# Patient Record
Sex: Male | Born: 1955 | Race: White | Hispanic: No | Marital: Married | Smoking: Never smoker
Health system: Southern US, Community
[De-identification: ages and names within clinical notes are randomized; demographics above are authoritative.]

## PROBLEM LIST (undated history)

## (undated) DIAGNOSIS — M1990 Unspecified osteoarthritis, unspecified site: Secondary | ICD-10-CM

## (undated) DIAGNOSIS — A809 Acute poliomyelitis, unspecified: Secondary | ICD-10-CM

## (undated) DIAGNOSIS — R0600 Dyspnea, unspecified: Secondary | ICD-10-CM

## (undated) DIAGNOSIS — K219 Gastro-esophageal reflux disease without esophagitis: Secondary | ICD-10-CM

## (undated) HISTORY — PX: BACK SURGERY: SHX140

---

## 2001-05-22 ENCOUNTER — Emergency Department (HOSPITAL_COMMUNITY): Admission: EM | Admit: 2001-05-22 | Discharge: 2001-05-22 | Payer: Self-pay | Admitting: Emergency Medicine

## 2001-05-22 ENCOUNTER — Encounter: Payer: Self-pay | Admitting: Emergency Medicine

## 2001-10-09 ENCOUNTER — Ambulatory Visit (HOSPITAL_BASED_OUTPATIENT_CLINIC_OR_DEPARTMENT_OTHER): Admission: RE | Admit: 2001-10-09 | Discharge: 2001-10-10 | Payer: Self-pay | Admitting: Orthopedic Surgery

## 2002-05-23 ENCOUNTER — Encounter: Payer: Self-pay | Admitting: Orthopedic Surgery

## 2002-05-23 ENCOUNTER — Ambulatory Visit (HOSPITAL_COMMUNITY): Admission: RE | Admit: 2002-05-23 | Discharge: 2002-05-23 | Payer: Self-pay | Admitting: Orthopedic Surgery

## 2003-11-13 ENCOUNTER — Ambulatory Visit (HOSPITAL_COMMUNITY): Admission: RE | Admit: 2003-11-13 | Discharge: 2003-11-13 | Payer: Self-pay | Admitting: Family Medicine

## 2004-11-09 ENCOUNTER — Ambulatory Visit (HOSPITAL_BASED_OUTPATIENT_CLINIC_OR_DEPARTMENT_OTHER): Admission: RE | Admit: 2004-11-09 | Discharge: 2004-11-09 | Payer: Self-pay | Admitting: Orthopedic Surgery

## 2004-11-09 ENCOUNTER — Ambulatory Visit (HOSPITAL_COMMUNITY): Admission: RE | Admit: 2004-11-09 | Discharge: 2004-11-09 | Payer: Self-pay | Admitting: Orthopedic Surgery

## 2008-12-31 ENCOUNTER — Ambulatory Visit (HOSPITAL_COMMUNITY): Admission: RE | Admit: 2008-12-31 | Discharge: 2009-01-01 | Payer: Self-pay | Admitting: Neurological Surgery

## 2009-03-08 ENCOUNTER — Encounter: Admission: RE | Admit: 2009-03-08 | Discharge: 2009-03-08 | Payer: Self-pay | Admitting: Neurological Surgery

## 2009-05-19 ENCOUNTER — Emergency Department (HOSPITAL_COMMUNITY): Admission: EM | Admit: 2009-05-19 | Discharge: 2009-05-19 | Payer: Self-pay | Admitting: Pediatric Emergency Medicine

## 2010-11-17 LAB — CBC
Hemoglobin: 14.7 g/dL (ref 13.0–17.0)
RBC: 4.7 MIL/uL (ref 4.22–5.81)
RDW: 13.4 % (ref 11.5–15.5)
WBC: 7.4 10*3/uL (ref 4.0–10.5)

## 2010-12-22 NOTE — Op Note (Signed)
NAMERENNE, PLATTS               ACCOUNT NO.:  [id number]   MEDICAL RECORD NO.:  [id number]          PATIENT TYPE:  INP   LOCATION:  3524                         FACILITY:  [facility]   PHYSICIAN:  Stefani Dama, M.D.  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   POSTOPERATIVE DIAGNOSIS:  Herniated nucleus pulposus at L2-L3, left with  lumbar radiculopathy.   PROCEDURE:  Microdiskectomy L2-L3, left with operating microscope  microdissection technique.   SURGEON:  Stefani Dama, MD   FIRST ASSISTANT:  Cristi Loron, MD   INDICATIONS:  [name] is a 55 year old individual who has had  significant pain and weakness in his left lower extremity.  He had polio  as a child and had weakness in the left leg with recent injury, but he  developed a herniated nucleus pulposus with severe left leg weakness in  the iliopsoas and the quad.  He has been advised regarding the need for  surgical intervention, which was to be done via METRx diskectomy,  however at the time of procedure, the METRx instrumentation was not  available and the microdiskectomy was performed.   PROCEDURE:  The patient was brought to the operating room, supine on a  stretcher.  After smooth induction of general endotracheal anesthesia,  he was turned prone.  The back was prepped with alcohol and DuraPrep,  and draped in a sterile fashion.  Fluoroscopic guidance was used to  localize the level of L2-L3 on the left side and then a small vertical  incision was created on the left side of the back.  The dissection was  carried down through the superficial fascia and then a series of METRx  dilators were placed in sequence to expose the interlaminar space at L2-  L3 on the left side.  Because, the tube was not available, the  dilators  were removed and a self-retaining McCullough retractor was placed in the  wound.  The interlaminar space was then cleared of soft tissue using a  monopolar cautery and then the laminotomy was created removing the  inferior margin lamina of L2 out to the medial wall of the facet.  The  yellow ligament was taken up in this area and a common dural tube was  exposed.  The takeoff of the L3 nerve root was identified.  This was  bowed medially, secondary to a large lateral mass.  Further dissection  of some epidural veins in this area, which were divided after being  cauterized yielded a large fragment of disk, singular fragment was first  removed and then 2 other fragments more distally located underneath the  nerve root were identified and these were removed, also allowed for good  mobility of the nerve root in the region of the foramen and no further  compressing fragments were identified.  More proximally, at the level of  the disk space, there was noted to be an opening from  posterior  longitudinal ligament into the area just medial to the common dural tube  where the initial disk fragment was found.  This area was opened with a  #15 blade and a combination of Kerrison rongeurs was then used to  evacuate this moderate quantity of severely degenerated and desiccated  disk material from within the disk space.  Once this space was cleared,  no other fragments could be found both medially and laterally.  The  epidural veins were checked for hemostasis.  The common dural tube and  the nerve roots were again explored and found to be free and clear in  the path at the foramen.  At this point, microscope was removed.  The  lumbodorsal fascia was closed with #1 Vicryl in interrupted fashion and  2-0 Vicryl was used in a subcutaneous tissues, and 3-0 Vicryl  subcuticularly.  Dr. Lovell Sheehan helped during the portions of the procedure  including the  exploration of the nerve root and removal of  disk, and evacuation of the  disk space both by providing retraction and also by doing lateral  portion of dissection.  The patient tolerated the procedure well.  Blood  loss was estimated at 20 mL.      Stefani Dama, M.D.  Electronically Signed     HJE/MEDQ  D:  12/31/2008  T:  01/01/2009  Job:  [id number]

## 2010-12-25 NOTE — Op Note (Signed)
[facility]  Patient:    WAYLIN, DORKO Visit Number: [id number] MRN: [id number]          Service Type: DSU Location: [facility] Attending Physician:  Twana First Dictated by:   Elana Alm Thurston Hole, M.D. Proc. Date: 10/09/01 Admit Date:  10/09/2001                             Operative Report  PREOPERATIVE DIAGNOSIS:  Left shoulder rotator cuff tear.  POSTOPERATIVE DIAGNOSES: 1. Left shoulder rotator cuff tear. 2. Left shoulder partial glenoid labrum tear. 3. Left shoulder impingement.  PROCEDURE: 1. Left shoulder examination under anesthesia followed by arthroscopic partial    labrum tear debridement. 2. Left shoulder rotator cuff repair. 3. Left shoulder subacromial decompression.  SURGEON:  Elana Alm. Thurston Hole, M.D.  ANESTHESIA:  General.  OPERATIVE TIME:  One hour and 15 minutes.  COMPLICATIONS:  None.  INDICATION FOR PROCEDURE:  Mr. Gibbons is a 55 year old gentleman who has had significant left shoulder pain for the past year increasing in nature.  His pain is made worse with activity and has not been relieved by rest or time. Persistent pain noted.  He is now to undergo arthroscopy and rotator cuff repair.  DESCRIPTION OF PROCEDURE:  Mr. Mcauliffe was brought to the operating room on October 09, 2001 after a supraclavicular block had been placed in the holding room.  He was placed on the operative table in supine position.  His left shoulder was examined under anesthesia.  He had full range of motion and his shoulder was stable to ligamentous exam.  He was then placed in a beach chair position and his shoulder and arm were prepped using sterile Betadine and draped using sterile technique.  Originally, through a posterior arthroscopic portal, the arthroscope with a pump attachment was placed and through an anterior portal, an arthroscope probe was placed.  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  The anteroinferior glenohumeral ligament complex was intact.  The middle and superior labrum was intact.  Biceps tendon anchor was intact.  Biceps tendon was intact. Posterior labrum was intact.  Articular cartilage on the rest of the humeral head was intact and the rest of the glenoid.  Rotator cuff was inspected from the articular surface; there was not a definite complete or full-thickness tear noted.  Subacromial space was entered and a lateral arthroscopic portal was made.  A large amount of bursitis was resected.  Underneath this, the rotator cuff showed a high-grade partial versus complete rotator cuff tear anteriorly at the supraspinatus anterior region.  Subacromial decompression was carried out, removing 6 to 8 mm of the undersurface of the anterior, anterolateral and anteromedial acromion, and a CA ligament release carried out; a lateral portal had been made to facilitate this.  After this was done, then just anterior to this portal, a 3-cm deltoid-splitting incision was made. The underlying subcutaneous tissues were incised in line with the skin incision and the subacromial space was entered.  The rotator cuff tear was well-visualized.  It was sharply debrided and then an Arthrex suture anchor was placed in the greater tuberosity and each of these sutures attached to it were passed through the rotator cuff tear and  tied down, thus securing the tear back down to the greater tuberosity.  After this was done, the shoulder could be brought through a full range of motion with no impingement on the repair.  At this point, the wound was irrigated and then a subacromial pain catheter was placed for postoperative pain control.  The deltoid fascia was then closed with 2-0  Vicryl, subcutaneous tissues closed with 2-0 Vicryl and Steri-Strips placed on the skin.  The arthroscopic portal was closed with 3-0 nylon, sterile dressings and a sling were applied and then the patient awakened and taken to recovery room in stable condition.  FOLLOWUP CARE:  Mr. Poynter will followed as an overnight recovery-care patient for IV pain control and neurovascular monitoring, discharged tomorrow on Percocet for pain and Bextra.  See him back in the office in a week for sutures out and followup. Dictated by:   Elana Alm Thurston Hole, M.D. Attending Physician:  Twana First DD:  10/09/01 TD:  10/09/01 Job: [id number]

## 2010-12-25 NOTE — Op Note (Signed)
Joshua Shelton, Joshua Shelton               ACCOUNT NO.:  [id number]   MEDICAL RECORD NO.:  [id number]          PATIENT TYPE:  AMB   LOCATION:  DSC                          FACILITY:  [facility]   PHYSICIAN:  Robert A. Thurston Hole, M.D. DATE OF BIRTH:  [birth year]   DATE OF PROCEDURE:  11/09/2004  DATE OF DISCHARGE:                                 OPERATIVE REPORT   PREOPERATIVE DIAGNOSIS:  Right shoulder partial rotator cuff tear with  partial labrum tear and impingement.   POSTOPERATIVE DIAGNOSIS:  Right shoulder partial rotator cuff tear with  partial labrum tear and impingement.   PROCEDURE:  1.  Right shoulder EUA followed by arthroscopic debridement partial rotator      cuff tear and partial labrum tear.  2.  Right shoulder subacromial decompression.   SURGEON:  Elana Alm. Thurston Hole, M.D.   ASSISTANT:  Julien Girt, P.A.   ANESTHESIA:  General.   OPERATIVE TIME:  45 minutes.   COMPLICATIONS:  None.   INDICATIONS FOR PROCEDURE:  Joshua Shelton is a 55 year old gentleman who has  been having significant right shoulder pain for the past six to eight  months, increasing in nature, with exam and MRI documenting a partial  rotator cuff tear with impingement who has failed conservative care and is  now to undergo arthroscopy.   DESCRIPTION:  Joshua Shelton was brought to operating room on November 09, 2004  after an interscalene block had been placed in the holding room. He is  placed operative table supine position. After being placed under general  anesthesia, his right shoulder was examined. He had full range of motion in  the shoulder was stable to ligamentous exam. He was then placed in beach  chair position and his shoulder amd arm were prepped using sterile DuraPrep  and draped using sterile technique. Originally through a posterior  arthroscopic portal, the arthroscope with a pump attached was placed and  through an anterior portal. Arthroscopic probe was placed. On initial  inspection the  articular cartilage of the glenohumeral joint showed mild  Grade 1 and 2 chondromalacia.  He had partial tearing of the labrum  anteriorly and superiorly 25 to 30% and this was debrided. Biceps tendon  anchor and biceps tendon was intact. Inferior labrum and anterior inferior  glenohumeral ligament complex was intact. Posterior labrum showed partial  tearing 25% and this was debrided.  Anterior inferior glenohumeral ligament  complex was intact. Inferior capsular recess free of pathology. Rotator cuff  was thoroughly inspected. He had only mild partial tearing on the articular  surface 20% supraspinatus and the infraspinatus and this was debrided.  Otherwise the rotator cuff was intact. After this was done the subacromial  space was entered. A lateral arthroscopic portal was made. Moderately  thickened bursitis was resected. The rotator cuff was inflamed and thickened  on the bursal surface with partial tearing 25 to 30% and this was debrided  but a complete tear was not found. Impingement was noted in a subacromial  decompression was carried out removing 6 mm of the undersurface of the  anterior, anterolateral, anteromedial acromion and CA ligament  release  carried out as well. The [facility] joint was not impinging on motion and was not  resected. After this was done, the shoulder could be brought through a full  range of motion with no impingement on the rotator cuff. At this point was  felt that all pathology been satisfactorily addressed. The instruments were  removed. Portals were closed with 3-0 nylon suture. Sterile dressings and a  sling applied and the patient awakened and taken to recovery room in stable  condition.   FOLLOW-UP CARE:  Joshua Shelton be followed as an outpatient on Percocet for  pain.  See him back in the office in a week for sutures out and follow-up.  Begin early physical therapy.       RAW/MEDQ  D:  11/09/2004  T:  11/09/2004  Job:  [id number]

## 2013-08-07 ENCOUNTER — Encounter: Payer: Self-pay | Admitting: Physical Medicine & Rehabilitation

## 2013-09-11 ENCOUNTER — Ambulatory Visit: Payer: Self-pay | Admitting: Physical Medicine & Rehabilitation

## 2018-08-16 ENCOUNTER — Other Ambulatory Visit: Payer: Self-pay | Admitting: Family Medicine

## 2018-08-16 ENCOUNTER — Ambulatory Visit
Admission: RE | Admit: 2018-08-16 | Discharge: 2018-08-16 | Disposition: A | Discharge status: Home / Self Care | Payer: Medicare Other | Source: Ambulatory Visit | Attending: Family Medicine | Admitting: Family Medicine

## 2018-08-16 DIAGNOSIS — J189 Pneumonia, unspecified organism: Secondary | ICD-10-CM

## 2018-08-25 ENCOUNTER — Ambulatory Visit (HOSPITAL_COMMUNITY): Payer: Medicare Other | Attending: Cardiovascular Disease

## 2018-08-25 ENCOUNTER — Other Ambulatory Visit (HOSPITAL_COMMUNITY): Payer: Self-pay | Admitting: Family Medicine

## 2018-08-25 ENCOUNTER — Other Ambulatory Visit: Payer: Self-pay | Admitting: Family Medicine

## 2018-08-25 ENCOUNTER — Other Ambulatory Visit: Payer: Self-pay

## 2018-08-25 DIAGNOSIS — R0600 Dyspnea, unspecified: Secondary | ICD-10-CM | POA: Diagnosis present

## 2018-08-25 DIAGNOSIS — G4733 Obstructive sleep apnea (adult) (pediatric): Secondary | ICD-10-CM

## 2019-02-13 ENCOUNTER — Observation Stay (HOSPITAL_COMMUNITY)
Admission: EM | Admit: 2019-02-13 | Discharge: 2019-02-14 | Disposition: A | Discharge status: Home / Self Care | Payer: Medicare Other | Attending: Internal Medicine | Admitting: Internal Medicine

## 2019-02-13 ENCOUNTER — Encounter (HOSPITAL_COMMUNITY): Payer: Self-pay

## 2019-02-13 ENCOUNTER — Other Ambulatory Visit: Payer: Self-pay

## 2019-02-13 ENCOUNTER — Emergency Department (HOSPITAL_COMMUNITY): Payer: Medicare Other

## 2019-02-13 DIAGNOSIS — L02519 Cutaneous abscess of unspecified hand: Secondary | ICD-10-CM

## 2019-02-13 DIAGNOSIS — E876 Hypokalemia: Secondary | ICD-10-CM | POA: Diagnosis present

## 2019-02-13 DIAGNOSIS — R0789 Other chest pain: Secondary | ICD-10-CM | POA: Diagnosis not present

## 2019-02-13 DIAGNOSIS — W458XXA Other foreign body or object entering through skin, initial encounter: Secondary | ICD-10-CM | POA: Diagnosis not present

## 2019-02-13 DIAGNOSIS — S61212A Laceration without foreign body of right middle finger without damage to nail, initial encounter: Principal | ICD-10-CM | POA: Insufficient documentation

## 2019-02-13 DIAGNOSIS — Z79899 Other long term (current) drug therapy: Secondary | ICD-10-CM | POA: Diagnosis not present

## 2019-02-13 DIAGNOSIS — L03011 Cellulitis of right finger: Secondary | ICD-10-CM | POA: Diagnosis present

## 2019-02-13 DIAGNOSIS — Z1159 Encounter for screening for other viral diseases: Secondary | ICD-10-CM | POA: Insufficient documentation

## 2019-02-13 HISTORY — DX: Acute poliomyelitis, unspecified: A80.9

## 2019-02-13 NOTE — ED Triage Notes (Signed)
Pt has swelling on right middle finger. Pt states he went away for the weekend and appeared on Monday. Pt has small lac on tip of finger.

## 2019-02-14 ENCOUNTER — Other Ambulatory Visit: Payer: Self-pay

## 2019-02-14 ENCOUNTER — Encounter (HOSPITAL_COMMUNITY): Payer: Self-pay | Admitting: Internal Medicine

## 2019-02-14 DIAGNOSIS — S61212A Laceration without foreign body of right middle finger without damage to nail, initial encounter: Secondary | ICD-10-CM | POA: Diagnosis not present

## 2019-02-14 DIAGNOSIS — Z1159 Encounter for screening for other viral diseases: Secondary | ICD-10-CM | POA: Diagnosis not present

## 2019-02-14 DIAGNOSIS — L03011 Cellulitis of right finger: Secondary | ICD-10-CM

## 2019-02-14 DIAGNOSIS — E876 Hypokalemia: Secondary | ICD-10-CM | POA: Diagnosis not present

## 2019-02-14 DIAGNOSIS — R0789 Other chest pain: Secondary | ICD-10-CM | POA: Diagnosis not present

## 2019-02-14 LAB — COMPREHENSIVE METABOLIC PANEL
ALT: 35 U/L (ref 0–44)
AST: 34 U/L (ref 15–41)
Albumin: 3.6 g/dL (ref 3.5–5.0)
Alkaline Phosphatase: 73 U/L (ref 38–126)
Anion gap: 8 (ref 5–15)
BUN: 16 mg/dL (ref 8–23)
CO2: 25 mmol/L (ref 22–32)
Calcium: 8.3 mg/dL — ABNORMAL LOW (ref 8.9–10.3)
Chloride: 105 mmol/L (ref 98–111)
Creatinine, Ser: 0.52 mg/dL — ABNORMAL LOW (ref 0.61–1.24)
GFR calc Af Amer: >60 mL/min (ref >60)
GFR calc non Af Amer: >60 mL/min (ref >60)
Glucose, Bld: 104 mg/dL — ABNORMAL HIGH (ref 70–99)
Potassium: 3.3 mmol/L — ABNORMAL LOW (ref 3.5–5.1)
Sodium: 138 mmol/L (ref 135–145)
Total Bilirubin: 0.1 mg/dL — ABNORMAL LOW (ref 0.3–1.2)
Total Protein: 6.5 g/dL (ref 6.5–8.1)

## 2019-02-14 LAB — C-REACTIVE PROTEIN: CRP: 12.3 mg/dL — ABNORMAL HIGH (ref <1.0)

## 2019-02-14 LAB — CBC WITH DIFFERENTIAL/PLATELET
Abs Immature Granulocytes: 0.03 10*3/uL (ref 0.00–0.07)
Basophils Absolute: 0 10*3/uL (ref 0.0–0.1)
Basophils Relative: 0 %
Eosinophils Absolute: 0.5 10*3/uL (ref 0.0–0.5)
Eosinophils Relative: 5 %
HCT: 37.8 % — ABNORMAL LOW (ref 39.0–52.0)
Hemoglobin: 12.3 g/dL — ABNORMAL LOW (ref 13.0–17.0)
Immature Granulocytes: 0 %
Lymphocytes Relative: 30 %
Lymphs Abs: 2.9 10*3/uL (ref 0.7–4.0)
MCH: 30.1 pg (ref 26.0–34.0)
MCHC: 32.5 g/dL (ref 30.0–36.0)
MCV: 92.6 fL (ref 80.0–100.0)
Monocytes Absolute: 1.2 10*3/uL — ABNORMAL HIGH (ref 0.1–1.0)
Monocytes Relative: 12 %
Neutro Abs: 5 10*3/uL (ref 1.7–7.7)
Neutrophils Relative %: 53 %
Platelets: 316 10*3/uL (ref 150–400)
RBC: 4.08 MIL/uL — ABNORMAL LOW (ref 4.22–5.81)
RDW: 13.3 % (ref 11.5–15.5)
WBC: 9.6 10*3/uL (ref 4.0–10.5)
nRBC: 0 % (ref 0.0–0.2)

## 2019-02-14 LAB — BASIC METABOLIC PANEL
Anion gap: 9 (ref 5–15)
BUN: 14 mg/dL (ref 8–23)
CO2: 24 mmol/L (ref 22–32)
Calcium: 8.1 mg/dL — ABNORMAL LOW (ref 8.9–10.3)
Chloride: 106 mmol/L (ref 98–111)
Creatinine, Ser: 0.51 mg/dL — ABNORMAL LOW (ref 0.61–1.24)
GFR calc Af Amer: >60 mL/min (ref >60)
GFR calc non Af Amer: >60 mL/min (ref >60)
Glucose, Bld: 103 mg/dL — ABNORMAL HIGH (ref 70–99)
Potassium: 3.5 mmol/L (ref 3.5–5.1)
Sodium: 139 mmol/L (ref 135–145)

## 2019-02-14 LAB — CBC
HCT: 35.8 % — ABNORMAL LOW (ref 39.0–52.0)
Hemoglobin: 11.4 g/dL — ABNORMAL LOW (ref 13.0–17.0)
MCH: 29.8 pg (ref 26.0–34.0)
MCHC: 31.8 g/dL (ref 30.0–36.0)
MCV: 93.7 fL (ref 80.0–100.0)
Platelets: 286 10*3/uL (ref 150–400)
RBC: 3.82 MIL/uL — ABNORMAL LOW (ref 4.22–5.81)
RDW: 13.4 % (ref 11.5–15.5)
WBC: 8.9 10*3/uL (ref 4.0–10.5)
nRBC: 0 % (ref 0.0–0.2)

## 2019-02-14 LAB — RAPID URINE DRUG SCREEN, HOSP PERFORMED
Amphetamines: NOT DETECTED
Barbiturates: NOT DETECTED
Benzodiazepines: NOT DETECTED
Cocaine: NOT DETECTED
Opiates: NOT DETECTED
Tetrahydrocannabinol: NOT DETECTED

## 2019-02-14 LAB — SARS CORONAVIRUS 2 BY RT PCR (HOSPITAL ORDER, PERFORMED IN ~~LOC~~ HOSPITAL LAB): SARS Coronavirus 2: NEGATIVE

## 2019-02-14 LAB — HEMOGLOBIN A1C
Hgb A1c MFr Bld: 5.5 % (ref 4.8–5.6)
Mean Plasma Glucose: 111.15 mg/dL

## 2019-02-14 LAB — HIV ANTIBODY (ROUTINE TESTING W REFLEX): HIV Screen 4th Generation wRfx: NONREACTIVE

## 2019-02-14 LAB — LIPID PANEL
Cholesterol: 146 mg/dL (ref 0–200)
HDL: 36 mg/dL — ABNORMAL LOW (ref >40)
LDL Cholesterol: 97 mg/dL (ref 0–99)
Total CHOL/HDL Ratio: 4.1 RATIO
Triglycerides: 66 mg/dL (ref <150)
VLDL: 13 mg/dL (ref 0–40)

## 2019-02-14 LAB — MAGNESIUM: Magnesium: 2 mg/dL (ref 1.7–2.4)

## 2019-02-14 LAB — APTT: aPTT: 36 seconds (ref 24–36)

## 2019-02-14 LAB — TROPONIN I (HIGH SENSITIVITY)
Troponin I (High Sensitivity): 3 ng/L (ref <18)
Troponin I (High Sensitivity): 2 ng/L (ref <18)

## 2019-02-14 LAB — SEDIMENTATION RATE: Sed Rate: 12 mm/hr (ref 0–16)

## 2019-02-14 LAB — PROTIME-INR
INR: 1 (ref 0.8–1.2)
Prothrombin Time: 12.9 seconds (ref 11.4–15.2)

## 2019-02-14 MED ORDER — SODIUM CHLORIDE 0.9 % IV SOLN: 2.0000 g | INTRAVENOUS | Status: DC

## 2019-02-14 MED ORDER — ONDANSETRON HCL 4 MG PO TABS: 4.0000 mg | ORAL_TABLET | Freq: Four times a day (QID) | ORAL | Status: DC | PRN

## 2019-02-14 MED ORDER — VANCOMYCIN HCL IN DEXTROSE 1-5 GM/200ML-% IV SOLN: 1000.0000 mg | Freq: Once | INTRAVENOUS | Status: DC

## 2019-02-14 MED ORDER — SODIUM CHLORIDE 0.9 % IV SOLN
1500.0000 mg | Freq: Once | INTRAVENOUS | Status: AC
  Administered 2019-02-14: 01:00:00 1500 mg via INTRAVENOUS
  Filled 2019-02-14: qty 1500

## 2019-02-14 MED ORDER — SULFAMETHOXAZOLE-TRIMETHOPRIM 800-160 MG PO TABS: 1.0000 | ORAL_TABLET | Freq: Two times a day (BID) | ORAL | 0 refills | Status: AC

## 2019-02-14 MED ORDER — OXYCODONE HCL ER 10 MG PO T12A
10.0000 mg | EXTENDED_RELEASE_TABLET | Freq: Two times a day (BID) | ORAL | Status: DC
Start: 2019-02-14 — End: 2019-02-14
  Administered 2019-02-14 (×2): 10 mg via ORAL
  Filled 2019-02-14 (×2): qty 1

## 2019-02-14 MED ORDER — SENNOSIDES-DOCUSATE SODIUM 8.6-50 MG PO TABS: 1.0000 | ORAL_TABLET | Freq: Every evening | ORAL | Status: DC | PRN

## 2019-02-14 MED ORDER — MORPHINE SULFATE (PF) 2 MG/ML IV SOLN: 2.0000 mg | INTRAVENOUS | Status: DC | PRN

## 2019-02-14 MED ORDER — SODIUM CHLORIDE 0.9 % IV SOLN
2.0000 g | Freq: Once | INTRAVENOUS | Status: AC
  Administered 2019-02-14: 01:00:00 2 g via INTRAVENOUS
  Filled 2019-02-14: qty 20

## 2019-02-14 MED ORDER — ACETAMINOPHEN 325 MG PO TABS
650.0000 mg | ORAL_TABLET | Freq: Once | ORAL | Status: AC
  Administered 2019-02-14: 650 mg via ORAL
  Filled 2019-02-14: qty 2

## 2019-02-14 MED ORDER — ONDANSETRON HCL 4 MG/2ML IJ SOLN: 4.0000 mg | Freq: Four times a day (QID) | INTRAMUSCULAR | Status: DC | PRN

## 2019-02-14 MED ORDER — NITROGLYCERIN 0.4 MG SL SUBL: 0.4000 mg | SUBLINGUAL_TABLET | SUBLINGUAL | Status: DC | PRN

## 2019-02-14 MED ORDER — OXYCODONE-ACETAMINOPHEN 5-325 MG PO TABS: 1.0000 | ORAL_TABLET | ORAL | Status: DC | PRN

## 2019-02-14 MED ORDER — VANCOMYCIN HCL 10 G IV SOLR: 1750.0000 mg | Freq: Every day | INTRAVENOUS | Status: DC

## 2019-02-14 MED ORDER — ACETAMINOPHEN 650 MG RE SUPP: 650.0000 mg | Freq: Four times a day (QID) | RECTAL | Status: DC | PRN

## 2019-02-14 MED ORDER — NAPROXEN 500 MG PO TABS
500.0000 mg | ORAL_TABLET | Freq: Once | ORAL | Status: AC
  Administered 2019-02-14: 500 mg via ORAL
  Filled 2019-02-14: qty 1

## 2019-02-14 MED ORDER — POTASSIUM CHLORIDE CRYS ER 20 MEQ PO TBCR
40.0000 meq | EXTENDED_RELEASE_TABLET | Freq: Once | ORAL | Status: AC
  Administered 2019-02-14: 40 meq via ORAL
  Filled 2019-02-14: qty 2

## 2019-02-14 MED ORDER — ACETAMINOPHEN 325 MG PO TABS
650.0000 mg | ORAL_TABLET | Freq: Four times a day (QID) | ORAL | Status: DC | PRN
  Administered 2019-02-14: 650 mg via ORAL
  Filled 2019-02-14: qty 2

## 2019-02-14 NOTE — ED Provider Notes (Signed)
[facility]-EMERGENCY DEPT Provider Note   CSN: [id number] Arrival date & time: 02/13/19  1807     History   Chief Complaint Chief Complaint  Patient presents with  . Cellulitis    right middle finger    HPI Meriel FlavorsRandall K Lawler is a 63 y.o. male.     HPI  63 year old male comes in a chief complaint of hand swelling.  Patient has no significant medical history.  He reports that he woke up on Sunday with some pain in his right long finger.  With time the pain has progressed along with swelling.  Today when he woke up the hand further swollen up and he started having worsening pain, therefore he decided to come to the ER.  Patient denies any known trauma to his right long finger.  He does indicate that he had a small laceration that he suffered in that digit few weeks ago and was working on his car this weekend.  He had also gone to a beach, but denies any activity of the water.  Patient is having numbness and tingling to the hand.  He denies any fevers, chills.  Past Medical History:  Diagnosis Date  . Polio     There are no active problems to display for this patient.   Past Surgical History:  Procedure Laterality Date  . BACK SURGERY          Home Medications    Prior to Admission medications   Medication Sig Start Date End Date Taking? Authorizing Provider  acetaminophen (TYLENOL) 650 MG CR tablet Take 650 mg by mouth every 8 (eight) hours.   Yes [provider]  naproxen sodium (ALEVE) 220 MG tablet Take 220 mg by mouth every 8 (eight) hours.   Yes [provider]  Oxycodone HCl 10 MG TABS Take 10 mg by mouth every 8 (eight) hours.  02/09/19  Yes [provider]  pregabalin (LYRICA) 50 MG capsule Take 50 mg by mouth every 8 (eight) hours. 01/08/19  Yes [provider]  tiZANidine (ZANAFLEX) 4 MG tablet Take 8 mg by mouth every 8 (eight) hours.  02/01/19  Yes [provider]    Family History No family  history on file.  Social History Social History   Tobacco Use  . Smoking status: Never Smoker  . Smokeless tobacco: Never Used  Substance Use Topics  . Alcohol use: Yes    Comment: occasionally  . Drug use: Never     Allergies   Patient has no known allergies.   Review of Systems Review of Systems  Constitutional: Positive for activity change.  Musculoskeletal: Positive for arthralgias and myalgias.  Allergic/Immunologic: Negative for immunocompromised state.  Neurological: Positive for numbness.  All other systems reviewed and are negative.    Physical Exam Updated Vital Signs BP 136/87 (BP Location: Left Arm)   Pulse 92   Temp 97.6 F (36.4 C) (Oral)   Resp 17   Wt 67.1 kg   SpO2 98%   Physical Exam Vitals signs and nursing note reviewed.  Constitutional:      Appearance: He is well-developed.  HENT:     Head: Atraumatic.  Neck:     Musculoskeletal: Neck supple.  Cardiovascular:     Rate and Rhythm: Normal rate.  Pulmonary:     Effort: Pulmonary effort is normal.  Musculoskeletal:        General: Swelling and tenderness present.     Comments: Right hand is edematous, erythematous and  warm to touch.  The right long finger appears to be worse than the rest of the head.  There is some blistering appreciated over the ulnar aspect of the digit.  No fluctuance or crepitus appreciated.  Skin:    General: Skin is warm.  Neurological:     Mental Status: He is alert and oriented to person, place, and time.      ED Treatments / Results  Labs (all labs ordered are listed, but only abnormal results are displayed) Labs Reviewed  COMPREHENSIVE METABOLIC PANEL - Abnormal; Notable for the following components:      Result Value   Potassium 3.3 (*)    Glucose, Bld 104 (*)    Creatinine, Ser 0.52 (*)    Calcium 8.3 (*)    Total Bilirubin 0.1 (*)    All other components within normal limits  CBC WITH DIFFERENTIAL/PLATELET - Abnormal; Notable for the following  components:   RBC 4.08 (*)    Hemoglobin 12.3 (*)    HCT 37.8 (*)    Monocytes Absolute 1.2 (*)    All other components within normal limits  SARS CORONAVIRUS 2 (HOSPITAL ORDER, PERFORMED IN [facility])  CULTURE, BLOOD (ROUTINE X 2)  CULTURE, BLOOD (ROUTINE X 2)    EKG None  Radiology Dg Finger Middle Right  Result Date: 02/13/2019 CLINICAL DATA:  63 y.o male has significant swelling on right middle finger and hand. Pt has small lac on tip of finger for 2+ weeks that he says keeps splitting open and not seeming to heal. Pt was washing car last Thursday and stuck hand in antifreeze. Pt went away to the beach this weekend but states he did not get in the water. Redness noted to inside of R middle finger running the length of digit. R/o cellulitis. Pt also has an artificial hip and is concerned of spread of infection. EXAM: RIGHT MIDDLE FINGER 2+V COMPARISON:  None. FINDINGS: No fracture or bone lesion. Joints are normally spaced and aligned. There are no areas of bone resorption to suggest osteomyelitis. There is soft tissue swelling throughout the middle finger most evident along the medial margin of the finger at the PIP joint level. No soft tissue air or radiopaque foreign body. IMPRESSION: 1. No fracture, bone lesion or evidence of osteomyelitis. 2. Soft tissue swelling with no soft tissue air. Electronically Signed   By: Amie Portlandavid  Ormond M.D.   On: 02/13/2019 19:41    Procedures Procedures (including critical care time)  Medications Ordered in ED Medications  oxyCODONE (OXYCONTIN) 12 hr tablet 10 mg (10 mg Oral Given 02/14/19 0104)  vancomycin (VANCOCIN) 1,500 mg in sodium chloride 0.9 % 500 mL IVPB (1,500 mg Intravenous New Bag/Given 02/14/19 0121)  cefTRIAXone (ROCEPHIN) 2 g in sodium chloride 0.9 % 100 mL IVPB (0 g Intravenous Stopped 02/14/19 0205)  naproxen (NAPROSYN) tablet 500 mg (500 mg Oral Given 02/14/19 0104)  acetaminophen (TYLENOL) tablet 650 mg (650 mg Oral Given  02/14/19 0104)     Initial Impression / Assessment and Plan / ED Course  I have reviewed the triage vital signs and the nursing notes.  Pertinent labs & imaging results that were available during my care of the patient were reviewed by me and considered in my medical decision making (see chart for details).        63 year old comes with a chief complaint of hand swelling.  It appears that 2 days ago he started noticing swelling in his right long finger, and yesterday  morning he started having swelling in his hand along with associated numbness.  No systemic symptoms of nausea, vomiting, chills or fevers.  Clinically it appears that he is likely in a phlegmonous state with cellulitis.  He could have abscess however especially over the right long finger.  We will admit him for IV antibiotics.  I spoke with Dr. Lenon Curt, who wants patient to be n.p.o. and for medicine to admit.  Patient is not septic.  Final Clinical Impressions(s) / ED Diagnoses   Final diagnoses:  Hand abscess    ED Discharge Orders    None       Varney Biles, MD 02/14/19 [phone]

## 2019-02-14 NOTE — Plan of Care (Signed)
No respiratory complications

## 2019-02-14 NOTE — Progress Notes (Signed)
A consult was received from an ED physician for vancomycin per pharmacy dosing.  The patient's profile has been reviewed for ht/wt/allergies/indication/available labs.   A one time order has been placed for Vancomycin 2 Gm .  Further antibiotics/pharmacy consults should be ordered by admitting physician if indicated.                       Thank you, Dorrene German 02/14/2019  12:20 AM

## 2019-02-14 NOTE — Progress Notes (Signed)
Pharmacy Antibiotic Note  Joshua Shelton is a 63 y.o. male admitted on 02/13/2019 with cellulitis.  Pharmacy has been consulted for vancomycin dosing.  Plan: Vancomycin 1500 mg x1 then 1750 mg IV q24h for est AUC = 517 Goal AUC = 400-550 Rocephin 2 Gm IV q24h (MD) F/u scr/cultures/levels  Height: 5\' 4"  (162.6 cm) Weight: 145 lb 15.1 oz (66.2 kg) IBW/kg (Calculated) : 59.2  Temp (24hrs), Avg:97.8 F (36.6 C), Min:97.6 F (36.4 C), Max:98.1 F (36.7 C)  Recent Labs  Lab 02/14/19 0012 02/14/19 0515  WBC 9.6 8.9  CREATININE 0.52* 0.51*    Estimated Creatinine Clearance: 80.2 mL/min (A) (by C-G formula based on SCr of 0.51 mg/dL (L)).    No Known Allergies  Antimicrobials this admission: 7/8 vancomycin >>  7/8 rocephin >>   Dose adjustments this admission:   Microbiology results:  BCx:   UCx:    Sputum:    MRSA PCR:   Thank you for allowing pharmacy to be a part of this patient's care.  Dorrene German 02/14/2019 6:10 AM

## 2019-02-14 NOTE — H&P (Signed)
History and Physical    Joshua Shelton MCN:[id number] DOB: October 16, 1955 DOA: 02/13/2019  Referring MD/NP/PA:   PCP: Leonard Downing, MD   Patient coming from:  The patient is coming from home.  At baseline, pt is independent for most of ADL.        Chief Complaint: right middle finger pain and chest pain  HPI: Joshua Shelton is a 63 y.o. male with medical history significant of polio, who presents with right middle finger pain.  Pt states that had small laceration in the tip of right middle finger when he was working on his car recently. He did not pay attention to it. He states that he noted swelling and pain in the right middle finger since Sunday.  The pain is constant, sharp, 10 out of 10 in severity, nonradiating.  He has mild subjective fever, but no chills.  His temperature is 97.6 in ED.  Patient also reports chest pain, which is located in the right side of the chest, intermittently in the past 2 days, nonradiating.  No shortness of breath.  Patient states that at one time he had 10 out of 10 of chest pain, which was pulsatile pain.  He denies pleuritic chest pain.  He states that his chest pain has resolved now.  Currently patient does not have chest pain, shortness of breath, cough.  Patient denies nausea vomiting, diarrhea, abdominal pain, symptoms of UTI or unilateral weakness.   ED Course: pt was found to have WBC 9.6, pending COVID-19 test, potassium 3.3, renal function normal, temperature normal, blood pressure 136/87, heart rate 92, RR 17, oxygen saturation 98% on room air.  X-ray of her right middle finger is negative for bony fracture.  Patient is placed on telemetry bed for observation.  Hand surgeon, Dr. Lenon Curt was consulted.  Review of Systems:   General: Has subjective fevers, no chills, no body weight gain, has fatigue HEENT: no blurry vision, hearing changes or sore throat Respiratory: no dyspnea, coughing, wheezing CV: has chest pain, no palpitations GI: no  nausea, vomiting, abdominal pain, diarrhea, constipation GU: no dysuria, burning on urination, increased urinary frequency, hematuria  Ext: no leg edema Neuro: no unilateral weakness, numbness, or tingling, no vision change or hearing loss Skin: has swelling and pain in right middle finger MSK: No muscle spasm, no deformity, no limitation of range of movement in spin Heme: No easy bruising.  Travel history: No recent long distant travel.  Allergy: No Known Allergies  Past Medical History:  Diagnosis Date  . Polio     Past Surgical History:  Procedure Laterality Date  . BACK SURGERY      Social History:  reports that he has never smoked. He has never used smokeless tobacco. He reports current alcohol use. He reports that he does not use drugs.  Family History:  Family History  Problem Relation Age of Onset  . Alzheimer's disease Father      Prior to Admission medications   Medication Sig Start Date End Date Taking? Authorizing Provider  acetaminophen (TYLENOL) 650 MG CR tablet Take 650 mg by mouth every 8 (eight) hours.   Yes [provider]  naproxen sodium (ALEVE) 220 MG tablet Take 220 mg by mouth every 8 (eight) hours.   Yes [provider]  Oxycodone HCl 10 MG TABS Take 10 mg by mouth every 8 (eight) hours.  02/09/19  Yes [provider]  pregabalin (LYRICA) 50 MG capsule Take 50 mg by mouth every 8 (  eight) hours. 01/08/19  Yes [provider]  tiZANidine (ZANAFLEX) 4 MG tablet Take 8 mg by mouth every 8 (eight) hours.  02/01/19  Yes [provider]    Physical Exam: Vitals:   02/14/19 0139 02/14/19 0300 02/14/19 0402 02/14/19 0511  BP: 136/87 120/79 128/81   Pulse: 92 62 67   Resp: _0 Temp:   97.7 F (36.5 C)   TempSrc:   Oral   SpO2: 98% 100% 95%   Weight:    66.2 kg  Height:    _1  (1.626 m)   General: Not in acute distress HEENT:       Eyes: PERRL, EOMI, no scleral icterus.       ENT: No discharge from the  ears and nose, no pharynx injection, no tonsillar enlargement.        Neck: No JVD, no bruit, no mass felt. Heme: No neck lymph node enlargement. Cardiac: S1/S2, RRR, No murmurs, No gallops or rubs. Respiratory:  No rales, wheezing, rhonchi or rubs. GI: Soft, nondistended, nontender, no rebound pain, no organomegaly, BS present. GU: No hematuria Ext: No pitting leg edema bilaterally. 2+DP/PT pulse bilaterally. Musculoskeletal: No joint deformities, No joint redness or warmth, no limitation of ROM in spin. Skin: has erythema, swelling and warmth in the right middle finger, with some blistering appearance in the right middle finger. Neuro: Alert, oriented X3, cranial nerves II-XII grossly intact, moves all extremities normally.   Psych: Patient is not psychotic, no suicidal or hemocidal ideation.  Labs on Admission: I have personally reviewed following labs and imaging studies  CBC: Recent Labs  Lab 02/14/19 0012 02/14/19 0515  WBC 9.6 8.9  NEUTROABS 5.0  --   HGB 12.3* 11.4*  HCT 37.8* 35.8*  MCV 92.6 93.7  PLT 316 426   Basic Metabolic Panel: Recent Labs  Lab 02/14/19 0012  NA 138  K 3.3*  CL 105  CO2 25  GLUCOSE 104*  BUN 16  CREATININE 0.52*  CALCIUM 8.3*   GFR: Estimated Creatinine Clearance: 80.2 mL/min (A) (by C-G formula based on SCr of 0.52 mg/dL (L)). Liver Function Tests: Recent Labs  Lab 02/14/19 0012  AST 34  ALT 35  ALKPHOS 73  BILITOT 0.1*  PROT 6.5  ALBUMIN 3.6   No results for input(s): LIPASE, AMYLASE in the last 168 hours. No results for input(s): AMMONIA in the last 168 hours. Coagulation Profile: No results for input(s): INR, PROTIME in the last 168 hours. Cardiac Enzymes: No results for input(s): CKTOTAL, CKMB, CKMBINDEX, TROPONINI in the last 168 hours. BNP (last 3 results) No results for input(s): PROBNP in the last 8760 hours. HbA1C: No results for input(s): HGBA1C in the last 72 hours. CBG: No results for input(s): GLUCAP in the  last 168 hours. Lipid Profile: No results for input(s): CHOL, HDL, LDLCALC, TRIG, CHOLHDL, LDLDIRECT in the last 72 hours. Thyroid Function Tests: No results for input(s): TSH, T4TOTAL, FREET4, T3FREE, THYROIDAB in the last 72 hours. Anemia Panel: No results for input(s): VITAMINB12, FOLATE, FERRITIN, TIBC, IRON, RETICCTPCT in the last 72 hours. Urine analysis: No results found for: COLORURINE, APPEARANCEUR, LABSPEC, PHURINE, GLUCOSEU, HGBUR, BILIRUBINUR, KETONESUR, PROTEINUR, UROBILINOGEN, NITRITE, LEUKOCYTESUR Sepsis Labs: _2 (procalcitonin:4,lacticidven:4) ) Recent Results (from the past 240 hour(s))  SARS Coronavirus 2 (CEPHEID - Performed in [place] hospital lab), Hosp Order     Status: None   Collection Time: 02/14/19 12:11 AM   Specimen: Nasopharyngeal Swab  Result Value Ref Range Status  SARS Coronavirus 2 NEGATIVE NEGATIVE Final    Comment: (NOTE) If result is NEGATIVE SARS-CoV-2 target nucleic acids are NOT DETECTED. The SARS-CoV-2 RNA is generally detectable in upper and lower  respiratory specimens during the acute phase of infection. The lowest  concentration of SARS-CoV-2 viral copies this assay can detect is 250  copies / mL. A negative result does not preclude SARS-CoV-2 infection  and should not be used as the sole basis for treatment or other  patient management decisions.  A negative result may occur with  improper specimen collection / handling, submission of specimen other  than nasopharyngeal swab, presence of viral mutation(s) within the  areas targeted by this assay, and inadequate number of viral copies  (<250 copies / mL). A negative result must be combined with clinical  observations, patient history, and epidemiological information. If result is POSITIVE SARS-CoV-2 target nucleic acids are DETECTED. The SARS-CoV-2 RNA is generally detectable in upper and lower  respiratory specimens dur ing the acute phase of infection.  Positive  results  are indicative of active infection with SARS-CoV-2.  Clinical  correlation with patient history and other diagnostic information is  necessary to determine patient infection status.  Positive results do  not rule out bacterial infection or co-infection with other viruses. If result is PRESUMPTIVE POSTIVE SARS-CoV-2 nucleic acids MAY BE PRESENT.   A presumptive positive result was obtained on the submitted specimen  and confirmed on repeat testing.  While 2019 novel coronavirus  (SARS-CoV-2) nucleic acids may be present in the submitted sample  additional confirmatory testing may be necessary for epidemiological  and / or clinical management purposes  to differentiate between  SARS-CoV-2 and other Sarbecovirus currently known to infect humans.  If clinically indicated additional testing with an alternate test  methodology [phone]) is advised. The SARS-CoV-2 RNA is generally  detectable in upper and lower respiratory sp ecimens during the acute  phase of infection. The expected result is Negative. Fact Sheet for Patients:  StrictlyIdeas.no Fact Sheet for Healthcare Providers: BankingDealers.co.za This test is not yet approved or cleared by the [place] FDA and has been authorized for detection and/or diagnosis of SARS-CoV-2 by FDA under an Emergency Use Authorization (EUA).  This EUA will remain in effect (meaning this test can be used) for the duration of the COVID-19 declaration under Section 564(b)(1) of the Act, 21 U.S.C. section 360bbb-3(b)(1), unless the authorization is terminated or revoked sooner. Performed at [facility], [place] [address] [place], [place] [address]      Radiological Exams on Admission: Dg Finger Middle Right  Result Date: 02/13/2019 CLINICAL DATA:  63 y.o male has significant swelling on right middle finger and hand. Pt has small lac on tip of finger for 2+ weeks that he says keeps  splitting open and not seeming to heal. Pt was washing car last Thursday and stuck hand in antifreeze. Pt went away to the beach this weekend but states he did not get in the water. Redness noted to inside of R middle finger running the length of digit. R/o cellulitis. Pt also has an artificial hip and is concerned of spread of infection. EXAM: RIGHT MIDDLE FINGER 2+V COMPARISON:  None. FINDINGS: No fracture or bone lesion. Joints are normally spaced and aligned. There are no areas of bone resorption to suggest osteomyelitis. There is soft tissue swelling throughout the middle finger most evident along the medial margin of the finger at the PIP joint level. No soft tissue air or radiopaque  foreign body. IMPRESSION: 1. No fracture, bone lesion or evidence of osteomyelitis. 2. Soft tissue swelling with no soft tissue air. Electronically Signed   By: Lajean Manes M.D.   On: 02/13/2019 19:41     EKG: Independently reviewed.  Sinus rhythm, QTC 428, early R wave progression, nonspecific T wave change.  Assessment/Plan Principal Problem:   Cellulitis of right middle finger Active Problems:   Hypokalemia   Atypical chest pain   Cellulitis of right middle finger: No fever or leukocytosis.  Clinically not septic.  Hemodynamically stable.  Hand surgeon, Dr. Lenon Curt was consulted. - place on telemetry bed for observation - - Empiric antimicrobial treatment with vancomycin, Rocephin - PRN Zofran for nausea, morphine and Percocet for pain - Blood cultures x 2  - ESR and CRP - Follow-up hand surgeon's recommendation and keep patient n.p.o. for possible surgery  Hypokalemia: K= 3.3 on admission. - Repleted - Check Mg level  Atypical chest pain: Patient has atypical chest pain.  Currently chest pain-free.  No shortness of breath.  No oxygen desaturation, low suspicions for PE. - Trend Trop - Repeat EKG in the am  - prn Nitroglycerin, Morphine - hold ASA now since pt will likely need surgery - Risk  factor stratification: will check FLP and A1C  - check UDS       DVT ppx: SCD Code Status: Full code Family Communication: None at bed side.      Disposition Plan:  Anticipate discharge back to previous home environment Consults called: Surgeon, Dr. Lenon Curt Admission status: Obs / tele    Date of Service 02/14/2019    [facility]   If 7PM-7AM, please contact night-coverage [email or web address] Password [email or web address] 02/14/2019, 6:02 AM

## 2019-02-14 NOTE — Discharge Summary (Signed)
Physician Discharge Summary  Meriel FlavorsRandall K Weyand UJW:[id number]:[id number] DOB: 05/02/1956 DOA: 02/13/2019  PCP: Kaleen MaskElkins, Wilson Oliver, MD  Admit date: 02/13/2019 Discharge date: 02/14/2019  Admitted From: Home Disposition: Home  Recommendations for Outpatient Follow-up:  1. Follow up with Dr. Kristine Lineaooley hand surgeon this afternoon at 3 PM in his office 2. Please f/u w PCP in a week  Home Health:no  Equipment/Devices:none  Discharge Condition: Stable CODE STATUS: FULL Diet recommendation: Heart Healthy,  Brief/Interim Summary:   63 y.o. male with medical history significant of polio, who presents with right middle finger pain.  Pt states that had small laceration in the tip of right middle finger when he was working on his car recently. He did not pay attention to it. He states that he noted swelling and pain in the right middle finger since Sunday.  The pain is constant, sharp, 10 out of 10 in severity, nonradiating.  He has mild subjective fever, but no chills.  His temperature is 97.6 in ED.  Patient also reports chest pain, which is located in the right side of the chest, intermittently in the past 2 days, nonradiating.  No shortness of breath.  Patient states that at one time he had 10 out of 10 of chest pain, which was pulsatile pain.  He denies pleuritic chest pain.  He states that his chest pain has resolved now.  Currently patient does not have chest pain, shortness of breath, cough.  Patient denies nausea vomiting, diarrhea, abdominal pain, symptoms of UTI or unilateral weakness.  ED Course: pt was found to have WBC 9.6, pending COVID-19 test, potassium 3.3, renal function normal, temperature normal, blood pressure 136/87, heart rate 92, RR 17, oxygen saturation 98% on room air.  X-ray of her right middle finger is negative for bony fracture.  Patient is placed on telemetry bed for observation.  Hand surgeon, Dr. Izora Ribasoley was consulted. Patient was kept over Night treated with IV vancomycin and  ceftriaxone. Dr. Georga BoraKohli from hand surgery reviewed the picture today and advised to discharge the patient and he will see the patient 3 PM in his clinic today.  Discharge Diagnoses:  Principal Problem:  Cellulitis of right middle finger:Clinically improved. ?mild purulence.  No e/o Sepsis, afebrile no leukocytosis.  Discussed with Dr. Kristine Lineaooley from hand surgery, he is going to see him in clinic at 3 pm-possible small nick/I&D and culture and okay to send on bactrim. Active Problems:  Hypokalemia:Resolved.  Atypical chest pain:Number chest pain.  High-sensitivity troponin was negative. F/u pcp  Discharge Instructions  Discharge Instructions    Call MD for:  difficulty breathing, headache or visual disturbances   Complete by: As directed    Call MD for:  severe uncontrolled pain   Complete by: As directed    Call MD for:  temperature >100.4   Complete by: As directed    Diet - low sodium heart healthy   Complete by: As directed    Discharge instructions   Complete by: As directed    Please go to Dr. Debby Budoley's office at 3 PM today for evla nad possible drainage and please go in empty stomach.   Increase activity slowly   Complete by: As directed      Allergies as of 02/14/2019   No Known Allergies     Medication List    TAKE these medications   acetaminophen 650 MG CR tablet Commonly known as: TYLENOL Take 650 mg by mouth every 8 (eight) hours.   naproxen sodium 220 MG tablet  Commonly known as: ALEVE Take 220 mg by mouth every 8 (eight) hours.   Oxycodone HCl 10 MG Tabs Take 10 mg by mouth every 8 (eight) hours.   pregabalin 50 MG capsule Commonly known as: LYRICA Take 50 mg by mouth every 8 (eight) hours.   sulfamethoxazole-trimethoprim 800-160 MG tablet Commonly known as: BACTRIM DS Take 1 tablet by mouth 2 (two) times daily for 7 days.   tiZANidine 4 MG tablet Commonly known as: ZANAFLEX Take 8 mg by mouth every 8 (eight) hours.      Follow-up Information     Kaleen MaskElkins, Wilson Oliver, MD Follow up in 1 week(s).   Specialty: Family Medicine Contact information: [address] [place] [address] [phone]        Knute Neuoley, Harrill, MD Follow up on 02/14/2019.   Specialty: General Surgery Why: at 3 pm, please go in empty stomach Contact information: [address] [place] [address] [phone]          No Known Allergies  Consultations:  Hand surgery Dr Izora Ribasoley  Procedures/Studies: Dg Finger Middle Right  Result Date: 02/13/2019 CLINICAL DATA:  63 y.o male has significant swelling on right middle finger and hand. Pt has small lac on tip of finger for 2+ weeks that he says keeps splitting open and not seeming to heal. Pt was washing car last Thursday and stuck hand in antifreeze. Pt went away to the beach this weekend but states he did not get in the water. Redness noted to inside of R middle finger running the length of digit. R/o cellulitis. Pt also has an artificial hip and is concerned of spread of infection. EXAM: RIGHT MIDDLE FINGER 2+V COMPARISON:  None. FINDINGS: No fracture or bone lesion. Joints are normally spaced and aligned. There are no areas of bone resorption to suggest osteomyelitis. There is soft tissue swelling throughout the middle finger most evident along the medial margin of the finger at the PIP joint level. No soft tissue air or radiopaque foreign body. IMPRESSION: 1. No fracture, bone lesion or evidence of osteomyelitis. 2. Soft tissue swelling with no soft tissue air. Electronically Signed   By: Amie Portlandavid  Ormond M.D.   On: 02/13/2019 19:41    Subjective: Feels well today.  No fever.  Pain and swelling in his middle finger significantly better.  Discharge Exam: Vitals:   02/14/19 0402 02/14/19 0605  BP: 128/81 (!) 140/101  Pulse: 67 95  Resp: 16 18  Temp: 97.7 F (36.5 C) 97.6 F (36.4 C)  SpO2: 95% 100%   Vitals:   02/14/19 0300 02/14/19 0402 02/14/19 0511 02/14/19 0605  BP: 120/79  128/81  (!) 140/101  Pulse: 62 67  95  Resp: 13 16  18   Temp:  97.7 F (36.5 C)  97.6 F (36.4 C)  TempSrc:  Oral  Oral  SpO2: 100% 95%  100%  Weight:   66.2 kg   Height:   5\' 4"  (1.626 m)     General: Pt is alert, awake, not in acute distress Cardiovascular: RRR, S1/S2 +, no rubs, no gallops Respiratory: CTA bilaterally, no wheezing, no rhonchi Abdominal: Soft, NT, ND, bowel sounds + Extremities: no edema, no cyanosis   The results of significant diagnostics from this hospitalization (including imaging, microbiology, ancillary and laboratory) are listed below for reference.     Microbiology: Recent Results (from the past 240 hour(s))  SARS Coronavirus 2 (CEPHEID - [facility]  Status: None   Collection Time: 02/14/19 12:11 AM   Specimen: Nasopharyngeal Swab  Result Value Ref Range Status   SARS Coronavirus 2 NEGATIVE NEGATIVE Final    Comment: (NOTE) If result is NEGATIVE SARS-CoV-2 target nucleic acids are NOT DETECTED. The SARS-CoV-2 RNA is generally detectable in upper and lower  respiratory specimens during the acute phase of infection. The lowest  concentration of SARS-CoV-2 viral copies this assay can detect is 250  copies / mL. A negative result does not preclude SARS-CoV-2 infection  and should not be used as the sole basis for treatment or other  patient management decisions.  A negative result may occur with  improper specimen collection / handling, submission of specimen other  than nasopharyngeal swab, presence of viral mutation(s) within the  areas targeted by this assay, and inadequate number of viral copies  (<250 copies / mL). A negative result must be combined with clinical  observations, patient history, and epidemiological information. If result is POSITIVE SARS-CoV-2 target nucleic acids are DETECTED. The SARS-CoV-2 RNA is generally detectable in upper and lower  respiratory specimens dur ing the acute phase  of infection.  Positive  results are indicative of active infection with SARS-CoV-2.  Clinical  correlation with patient history and other diagnostic information is  necessary to determine patient infection status.  Positive results do  not rule out bacterial infection or co-infection with other viruses. If result is PRESUMPTIVE POSTIVE SARS-CoV-2 nucleic acids MAY BE PRESENT.   A presumptive positive result was obtained on the submitted specimen  and confirmed on repeat testing.  While 2019 novel coronavirus  (SARS-CoV-2) nucleic acids may be present in the submitted sample  additional confirmatory testing may be necessary for epidemiological  and / or clinical management purposes  to differentiate between  SARS-CoV-2 and other Sarbecovirus currently known to infect humans.  If clinically indicated additional testing with an alternate test  methodology [phone]) is advised. The SARS-CoV-2 RNA is generally  detectable in upper and lower respiratory sp ecimens during the acute  phase of infection. The expected result is Negative. Fact Sheet for Patients:  BoilerBrush.com.cy Fact Sheet for Healthcare Providers: https://pope.com/ This test is not yet approved or cleared by the [place] FDA and has been authorized for detection and/or diagnosis of SARS-CoV-2 by FDA under an Emergency Use Authorization (EUA).  This EUA will remain in effect (meaning this test can be used) for the duration of the COVID-19 declaration under Section 564(b)(1) of the Act, 21 U.S.C. section 360bbb-3(b)(1), unless the authorization is terminated or revoked sooner. Performed at [facility], [address] [place], [place] [address]      Labs: BNP (last 3 results) No results for input(s): BNP in the last 8760 hours. Basic Metabolic Panel: Recent Labs  Lab 02/14/19 0012 02/14/19 0515  NA 138 139  K 3.3* 3.5  CL 105 106  CO2 25 24   GLUCOSE 104* 103*  BUN 16 14  CREATININE 0.52* 0.51*  CALCIUM 8.3* 8.1*  MG  --  2.0   Liver Function Tests: Recent Labs  Lab 02/14/19 0012  AST 34  ALT 35  ALKPHOS 73  BILITOT 0.1*  PROT 6.5  ALBUMIN 3.6   No results for input(s): LIPASE, AMYLASE in the last 168 hours. No results for input(s): AMMONIA in the last 168 hours. CBC: Recent Labs  Lab 02/14/19 0012 02/14/19 0515  WBC 9.6 8.9  NEUTROABS 5.0  --   HGB 12.3* 11.4*  HCT 37.8*  35.8*  MCV 92.6 93.7  PLT 316 286   Cardiac Enzymes: No results for input(s): CKTOTAL, CKMB, CKMBINDEX, TROPONINI in the last 168 hours. BNP: Invalid input(s): POCBNP CBG: No results for input(s): GLUCAP in the last 168 hours. D-Dimer No results for input(s): DDIMER in the last 72 hours. Hgb A1c Recent Labs    02/14/19 0515  HGBA1C 5.5   Lipid Profile Recent Labs    02/14/19 0515  CHOL 146  HDL 36*  LDLCALC 97  TRIG 66  CHOLHDL 4.1   Thyroid function studies No results for input(s): TSH, T4TOTAL, T3FREE, THYROIDAB in the last 72 hours.  Invalid input(s): FREET3 Anemia work up No results for input(s): VITAMINB12, FOLATE, FERRITIN, TIBC, IRON, RETICCTPCT in the last 72 hours. Urinalysis No results found for: COLORURINE, APPEARANCEUR, LABSPEC, PHURINE, GLUCOSEU, HGBUR, BILIRUBINUR, KETONESUR, PROTEINUR, UROBILINOGEN, NITRITE, LEUKOCYTESUR Sepsis Labs Invalid input(s): PROCALCITONIN,  WBC,  LACTICIDVEN Microbiology Recent Results (from the past 240 hour(s))  SARS Coronavirus 2 (CEPHEID - Performed in [facility] lab), Hosp Order     Status: None   Collection Time: 02/14/19 12:11 AM   Specimen: Nasopharyngeal Swab  Result Value Ref Range Status   SARS Coronavirus 2 NEGATIVE NEGATIVE Final    Comment: (NOTE) If result is NEGATIVE SARS-CoV-2 target nucleic acids are NOT DETECTED. The SARS-CoV-2 RNA is generally detectable in upper and lower  respiratory specimens during the acute phase of infection. The  lowest  concentration of SARS-CoV-2 viral copies this assay can detect is 250  copies / mL. A negative result does not preclude SARS-CoV-2 infection  and should not be used as the sole basis for treatment or other  patient management decisions.  A negative result may occur with  improper specimen collection / handling, submission of specimen other  than nasopharyngeal swab, presence of viral mutation(s) within the  areas targeted by this assay, and inadequate number of viral copies  (<250 copies / mL). A negative result must be combined with clinical  observations, patient history, and epidemiological information. If result is POSITIVE SARS-CoV-2 target nucleic acids are DETECTED. The SARS-CoV-2 RNA is generally detectable in upper and lower  respiratory specimens dur ing the acute phase of infection.  Positive  results are indicative of active infection with SARS-CoV-2.  Clinical  correlation with patient history and other diagnostic information is  necessary to determine patient infection status.  Positive results do  not rule out bacterial infection or co-infection with other viruses. If result is PRESUMPTIVE POSTIVE SARS-CoV-2 nucleic acids MAY BE PRESENT.   A presumptive positive result was obtained on the submitted specimen  and confirmed on repeat testing.  While 2019 novel coronavirus  (SARS-CoV-2) nucleic acids may be present in the submitted sample  additional confirmatory testing may be necessary for epidemiological  and / or clinical management purposes  to differentiate between  SARS-CoV-2 and other Sarbecovirus currently known to infect humans.  If clinically indicated additional testing with an alternate test  methodology [phone] is advised. The SARS-CoV-2 RNA is generally  detectable in upper and lower respiratory sp ecimens during the acute  phase of infection. The expected result is Negative. Fact Sheet for Patients:   BoilerBrush.com.cyhttps://www.fda.gov/media/136312/download Fact Sheet for Healthcare Providers: https://pope.com/https://www.fda.gov/media/136313/download This test is not yet approved or cleared by the [place] FDA and has been authorized for detection and/or diagnosis of SARS-CoV-2 by FDA under an Emergency Use Authorization (EUA).  This EUA will remain in effect (meaning this test can be used) for the duration  of the COVID-19 declaration under Section 564(b)(1) of the Act, 21 U.S.C. section 360bbb-3(b)(1), unless the authorization is terminated or revoked sooner. Performed at [facility], [place] [address], [place], [place] [address]      Time coordinating discharge: 25 minutes  SIGNED:   Antonieta Pert, MD  [facility] 02/14/2019, 11:44 AM  If 7PM-7AM, please contact night-coverage [email or web address]

## 2019-02-14 NOTE — ED Notes (Signed)
ED TO INPATIENT HANDOFF REPORT  ED Nurse Name and Phone #: jon wled   S Name/Age/Gender Joshua Shelton 63 y.o. male Room/Bed: WA20/WA20  Code Status   Code Status: Not on file  Home/SNF/Other Home {Patient oriented alert x4  Is this baseline? Yes  Triage Complete: Triage complete  Chief Complaint finger injury   Triage Note Pt has swelling on right middle finger. Pt states he went away for the weekend and appeared on Monday. Pt has small lac on tip of finger.    Allergies No Known Allergies  Level of Care/Admitting Diagnosis ED Disposition    ED Disposition Condition Comment   Admit  Hospital Area: [facility] [[id number]]  Level of Care: Telemetry [5]  Admit to tele based on following criteria: Other see comments  Comments: CP  Covid Evaluation: Asymptomatic Screening Protocol (No Symptoms)  Diagnosis: Cellulitis of right middle finger [[id number]][[id number]]  Admitting Physician: Lorretta HarpNIU, XILIN [[id number]]  Attending Physician: Lorretta HarpNIU, XILIN [[id number]]  PT Class (Do Not Modify): Observation [104]  PT Acc Code (Do Not Modify): Observation [10022]       B Medical/Surgery History Past Medical History:  Diagnosis Date  . Polio    Past Surgical History:  Procedure Laterality Date  . BACK SURGERY       A IV Location/Drains/Wounds Patient Lines/Drains/Airways Status   Active Line/Drains/Airways    Name:   Placement date:   Placement time:   Site:   Days:   Peripheral IV 02/14/19 Right Arm   02/14/19    0057    Arm   less than 1   Peripheral IV 02/14/19 Left Arm   02/14/19    0112    Arm   less than 1          Intake/Output Last 24 hours No intake or output data in the 24 hours ending 02/14/19 0325  Labs/Imaging Results for orders placed or performed during the hospital encounter of 02/13/19 (from the past 48 hour(s))  SARS Coronavirus 2 (CEPHEID - Performed in [facility] hospital lab), Hosp Order     Status: None   Collection Time: 02/14/19 12:11 AM   Specimen: Nasopharyngeal Swab  Result Value Ref Range   SARS Coronavirus 2 NEGATIVE NEGATIVE    Comment: (NOTE) If result is NEGATIVE SARS-CoV-2 target nucleic acids are NOT DETECTED. The SARS-CoV-2 RNA is generally detectable in upper and lower  respiratory specimens during the acute phase of infection. The lowest  concentration of SARS-CoV-2 viral copies this assay can detect is 250  copies / mL. A negative result does not preclude SARS-CoV-2 infection  and should not be used as the sole basis for treatment or other  patient management decisions.  A negative result may occur with  improper specimen collection / handling, submission of specimen other  than nasopharyngeal swab, presence of viral mutation(s) within the  areas targeted by this assay, and inadequate number of viral copies  (<250 copies / mL). A negative result must be combined with clinical  observations, patient history, and epidemiological information. If result is POSITIVE SARS-CoV-2 target nucleic acids are DETECTED. The SARS-CoV-2 RNA is generally detectable in upper and lower  respiratory specimens dur ing the acute phase of infection.  Positive  results are indicative of active infection with SARS-CoV-2.  Clinical  correlation with patient history and other diagnostic information is  necessary to determine patient infection status.  Positive results do  not rule out bacterial infection or co-infection with other viruses. If  result is PRESUMPTIVE POSTIVE SARS-CoV-2 nucleic acids MAY BE PRESENT.   A presumptive positive result was obtained on the submitted specimen  and confirmed on repeat testing.  While 2019 novel coronavirus  (SARS-CoV-2) nucleic acids may be present in the submitted sample  additional confirmatory testing may be necessary for epidemiological  and / or clinical management purposes  to differentiate between  SARS-CoV-2 and other Sarbecovirus currently known to infect humans.  If clinically  indicated additional testing with an alternate test  methodology [phone]([id number]) is advised. The SARS-CoV-2 RNA is generally  detectable in upper and lower respiratory sp ecimens during the acute  phase of infection. The expected result is Negative. Fact Sheet for Patients:  BoilerBrush.com.cyhttps://www.fda.gov/media/136312/download Fact Sheet for Healthcare Providers: https://pope.com/https://www.fda.gov/media/136313/download This test is not yet approved or cleared by the [place] FDA and has been authorized for detection and/or diagnosis of SARS-CoV-2 by FDA under an Emergency Use Authorization (EUA).  This EUA will remain in effect (meaning this test can be used) for the duration of the COVID-19 declaration under Section 564(b)(1) of the Act, 21 U.S.C. section 360bbb-3(b)(1), unless the authorization is terminated or revoked sooner. Performed at [facility], [address] [place], [place] [address]   Comprehensive metabolic panel     Status: Abnormal   Collection Time: 02/14/19 12:12 AM  Result Value Ref Range   Sodium 138 135 - 145 mmol/L   Potassium 3.3 (L) 3.5 - 5.1 mmol/L   Chloride 105 98 - 111 mmol/L   CO2 25 22 - 32 mmol/L   Glucose, Bld 104 (H) 70 - 99 mg/dL   BUN 16 8 - 23 mg/dL   Creatinine, Ser 8.110.52 (L) 0.61 - 1.24 mg/dL   Calcium 8.3 (L) 8.9 - 10.3 mg/dL   Total Protein 6.5 6.5 - 8.1 g/dL   Albumin 3.6 3.5 - 5.0 g/dL   AST 34 15 - 41 U/L   ALT 35 0 - 44 U/L   Alkaline Phosphatase 73 38 - 126 U/L   Total Bilirubin 0.1 (L) 0.3 - 1.2 mg/dL   GFR calc non Af Amer >60 >60 mL/min   GFR calc Af Amer >60 >60 mL/min   Anion gap 8 5 - 15    Comment: Performed at [facility], [address] [place], [place] [address]  CBC WITH DIFFERENTIAL     Status: Abnormal   Collection Time: 02/14/19 12:12 AM  Result Value Ref Range   WBC 9.6 4.0 - 10.5 K/uL   RBC 4.08 (L) 4.22 - 5.81 MIL/uL   Hemoglobin 12.3 (L) 13.0 - 17.0 g/dL   HCT 29.537.8 (L) 62.139.0 - 30.852.0 %   MCV 92.6  80.0 - 100.0 fL   MCH 30.1 26.0 - 34.0 pg   MCHC 32.5 30.0 - 36.0 g/dL   RDW 65.713.3 84.611.5 - 96.215.5 %   Platelets 316 150 - 400 K/uL   nRBC 0.0 0.0 - 0.2 %   Neutrophils Relative % 53 %   Neutro Abs 5.0 1.7 - 7.7 K/uL   Lymphocytes Relative 30 %   Lymphs Abs 2.9 0.7 - 4.0 K/uL   Monocytes Relative 12 %   Monocytes Absolute 1.2 (H) 0.1 - 1.0 K/uL   Eosinophils Relative 5 %   Eosinophils Absolute 0.5 0.0 - 0.5 K/uL   Basophils Relative 0 %   Basophils Absolute 0.0 0.0 - 0.1 K/uL   Immature Granulocytes 0 %   Abs Immature Granulocytes 0.03 0.00 - 0.07 K/uL  Comment: Performed at [facility], [place] [address] [place], [place] [address]   Dg Finger Middle Right  Result Date: 02/13/2019 CLINICAL DATA:  63 y.o male has significant swelling on right middle finger and hand. Pt has small lac on tip of finger for 2+ weeks that he says keeps splitting open and not seeming to heal. Pt was washing car last Thursday and stuck hand in antifreeze. Pt went away to the beach this weekend but states he did not get in the water. Redness noted to inside of R middle finger running the length of digit. R/o cellulitis. Pt also has an artificial hip and is concerned of spread of infection. EXAM: RIGHT MIDDLE FINGER 2+V COMPARISON:  None. FINDINGS: No fracture or bone lesion. Joints are normally spaced and aligned. There are no areas of bone resorption to suggest osteomyelitis. There is soft tissue swelling throughout the middle finger most evident along the medial margin of the finger at the PIP joint level. No soft tissue air or radiopaque foreign body. IMPRESSION: 1. No fracture, bone lesion or evidence of osteomyelitis. 2. Soft tissue swelling with no soft tissue air. Electronically Signed   By: Lajean Manes M.D.   On: 02/13/2019 19:41    Pending Labs Unresulted Labs (From admission, onward)    Start     Ordered   02/14/19 0012  Blood Culture (routine x 2)  BLOOD CULTURE X 2,   STAT     02/14/19  0012   Signed and Held  Magnesium  Tomorrow morning,   R     Signed and Held   Signed and Held  Sedimentation rate  Once,   R     Signed and Held   Signed and Held  C-reactive protein  Once,   R     Signed and Held   Signed and Held  Hemoglobin A1c  Tomorrow morning,   R     Signed and Held   Signed and Held  Lipid panel  Tomorrow morning,   R    Comments: Please obtain as a fasting lipid panel - should not have eaten/ drank food for 8 hours prior to labs.    Signed and Held   Signed and Held  Urine rapid drug screen (hosp performed)  ONCE - STAT,   R     Signed and Held   Signed and Held  Troponin I (High Sensitivity)  STAT Now then every 2 hours,   R    Question:  Indication  Answer:  Suspect ACS   Signed and Held   Signed and Held  HIV antibody (Routine Testing)  Once,   R     Signed and Held   Signed and Held  Protime-INR  Once,   R     Signed and Held   Signed and Held  APTT  Once,   R     Signed and Held   Signed and Occupational hygienist morning,   R     Signed and Held   Signed and Held  CBC  Tomorrow morning,   R     Signed and Held          Vitals/Pain Today's Vitals   02/13/19 1837 02/13/19 2328 02/14/19 0139 02/14/19 0319  BP:  136/87 136/87   Pulse:  92 92   Resp:  17 17   Temp:  97.6 F (36.4 C)    TempSrc:  Oral  SpO2:  98% 98%   Weight: 67.1 kg     PainSc:  0-No pain 0-No pain 1     Isolation Precautions No active isolations  Medications Medications  oxyCODONE (OXYCONTIN) 12 hr tablet 10 mg (10 mg Oral Given 02/14/19 0104)  cefTRIAXone (ROCEPHIN) 2 g in sodium chloride 0.9 % 100 mL IVPB (0 g Intravenous Stopped 02/14/19 0205)  naproxen (NAPROSYN) tablet 500 mg (500 mg Oral Given 02/14/19 0104)  acetaminophen (TYLENOL) tablet 650 mg (650 mg Oral Given 02/14/19 0104)  vancomycin (VANCOCIN) 1,500 mg in sodium chloride 0.9 % 500 mL IVPB (1,500 mg Intravenous New Bag/Given 02/14/19 0121)    Mobility walks Low fall risk   Focused  Assessment  skin assessment    R Recommendations: See Admitting Provider Note  Report given to:   Additional Notes:

## 2019-02-19 LAB — CULTURE, BLOOD (ROUTINE X 2)
Culture: NO GROWTH
Culture: NO GROWTH
Special Requests: ADEQUATE
Special Requests: ADEQUATE

## 2021-01-21 IMAGING — CR RIGHT MIDDLE FINGER 2+V
3 series · 3 of 3 positions shown · non-contrast
Comparison: None.

CLINICAL DATA: 62 y.o male has significant swelling on right middle
finger and hand. Pt has small lac on tip of finger for 2+ weeks that
he says keeps splitting open and not seeming to heal. Pt was washing
[REDACTED] and stuck hand in antifreeze. Pt went away to the
beach this weekend but states he did not get in the water. Redness
noted to inside of R middle finger running the length of digit. R/o
cellulitis. Pt also has an artificial hip and is concerned of spread
of infection.

EXAM:
RIGHT MIDDLE FINGER 2+V

[x finger pa right]
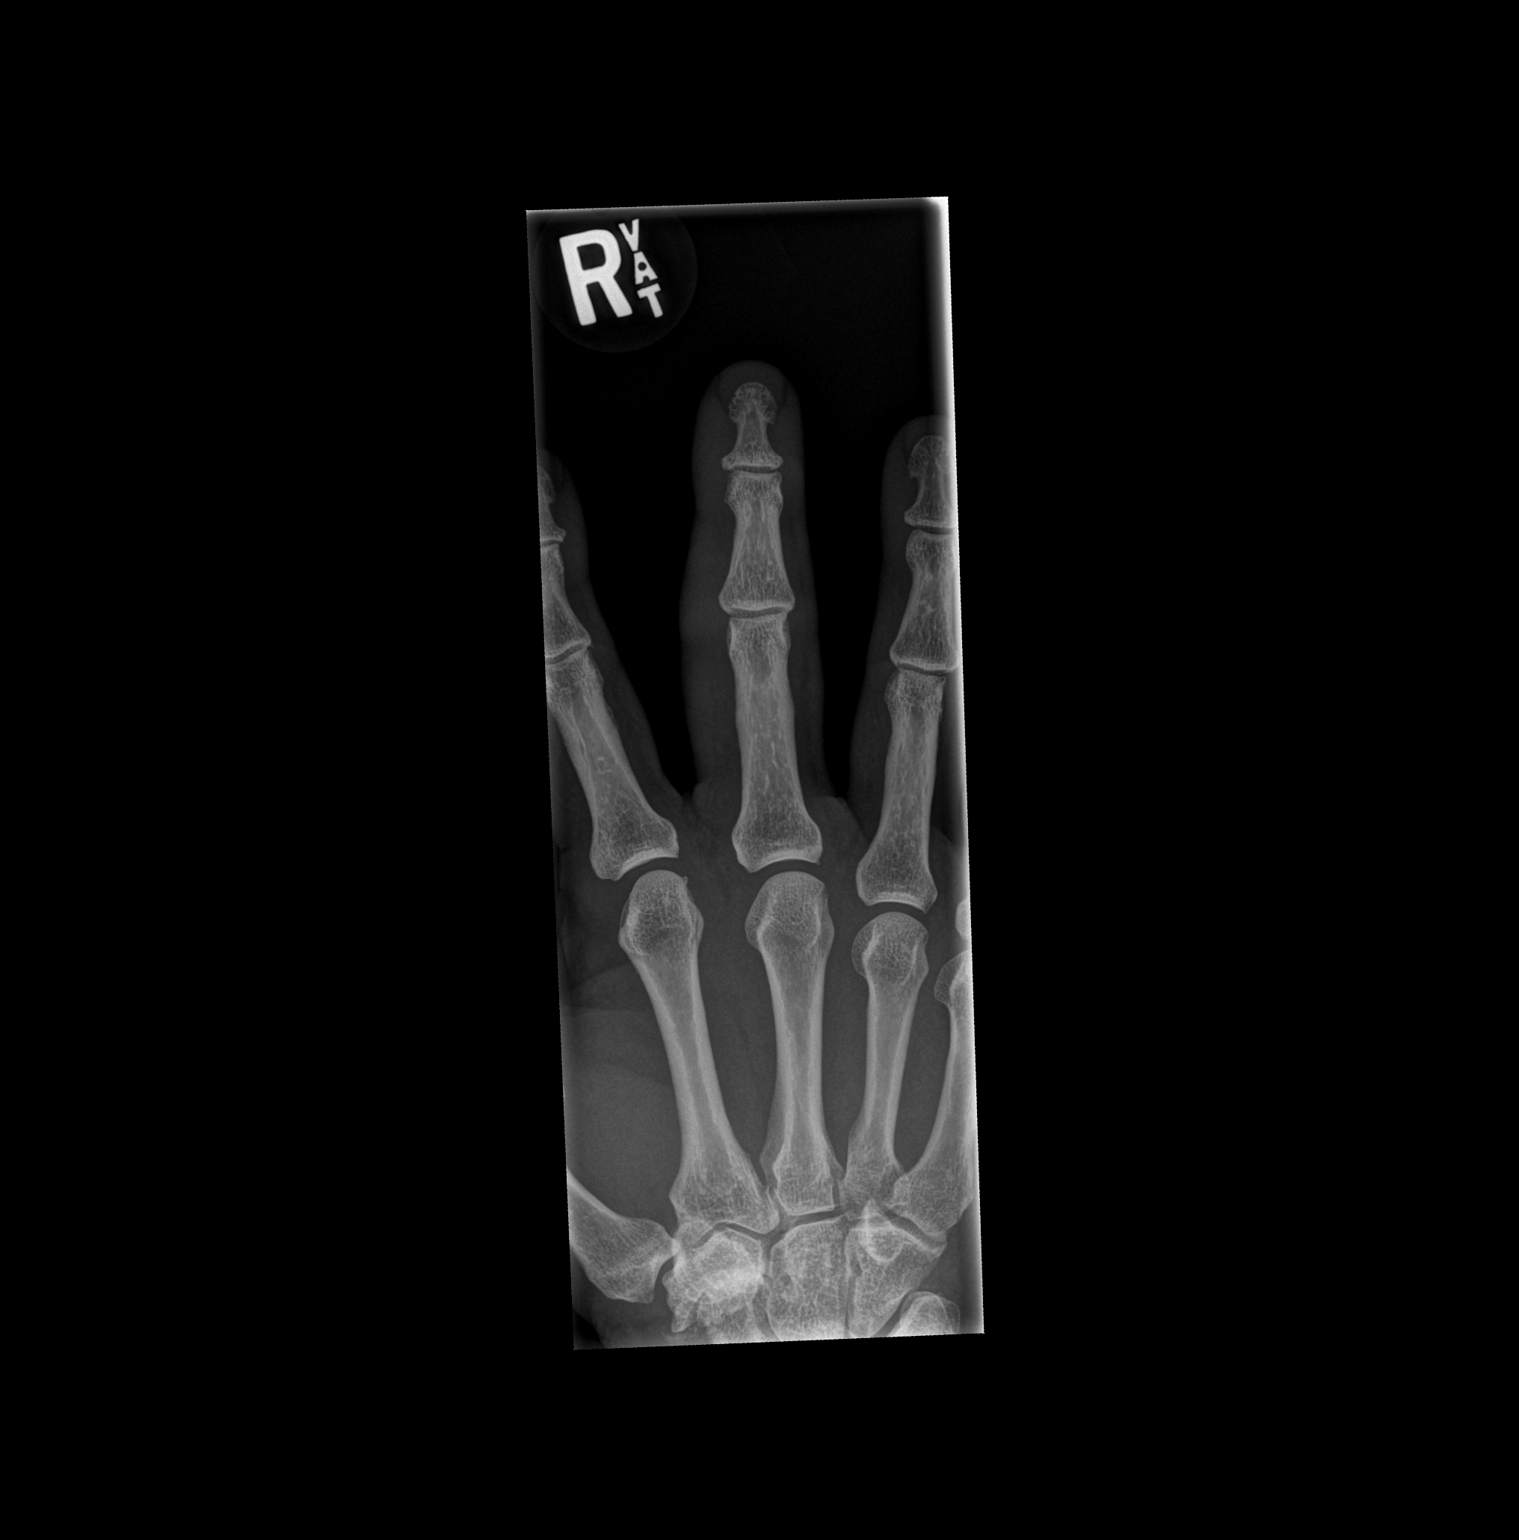

[x finger obl right]
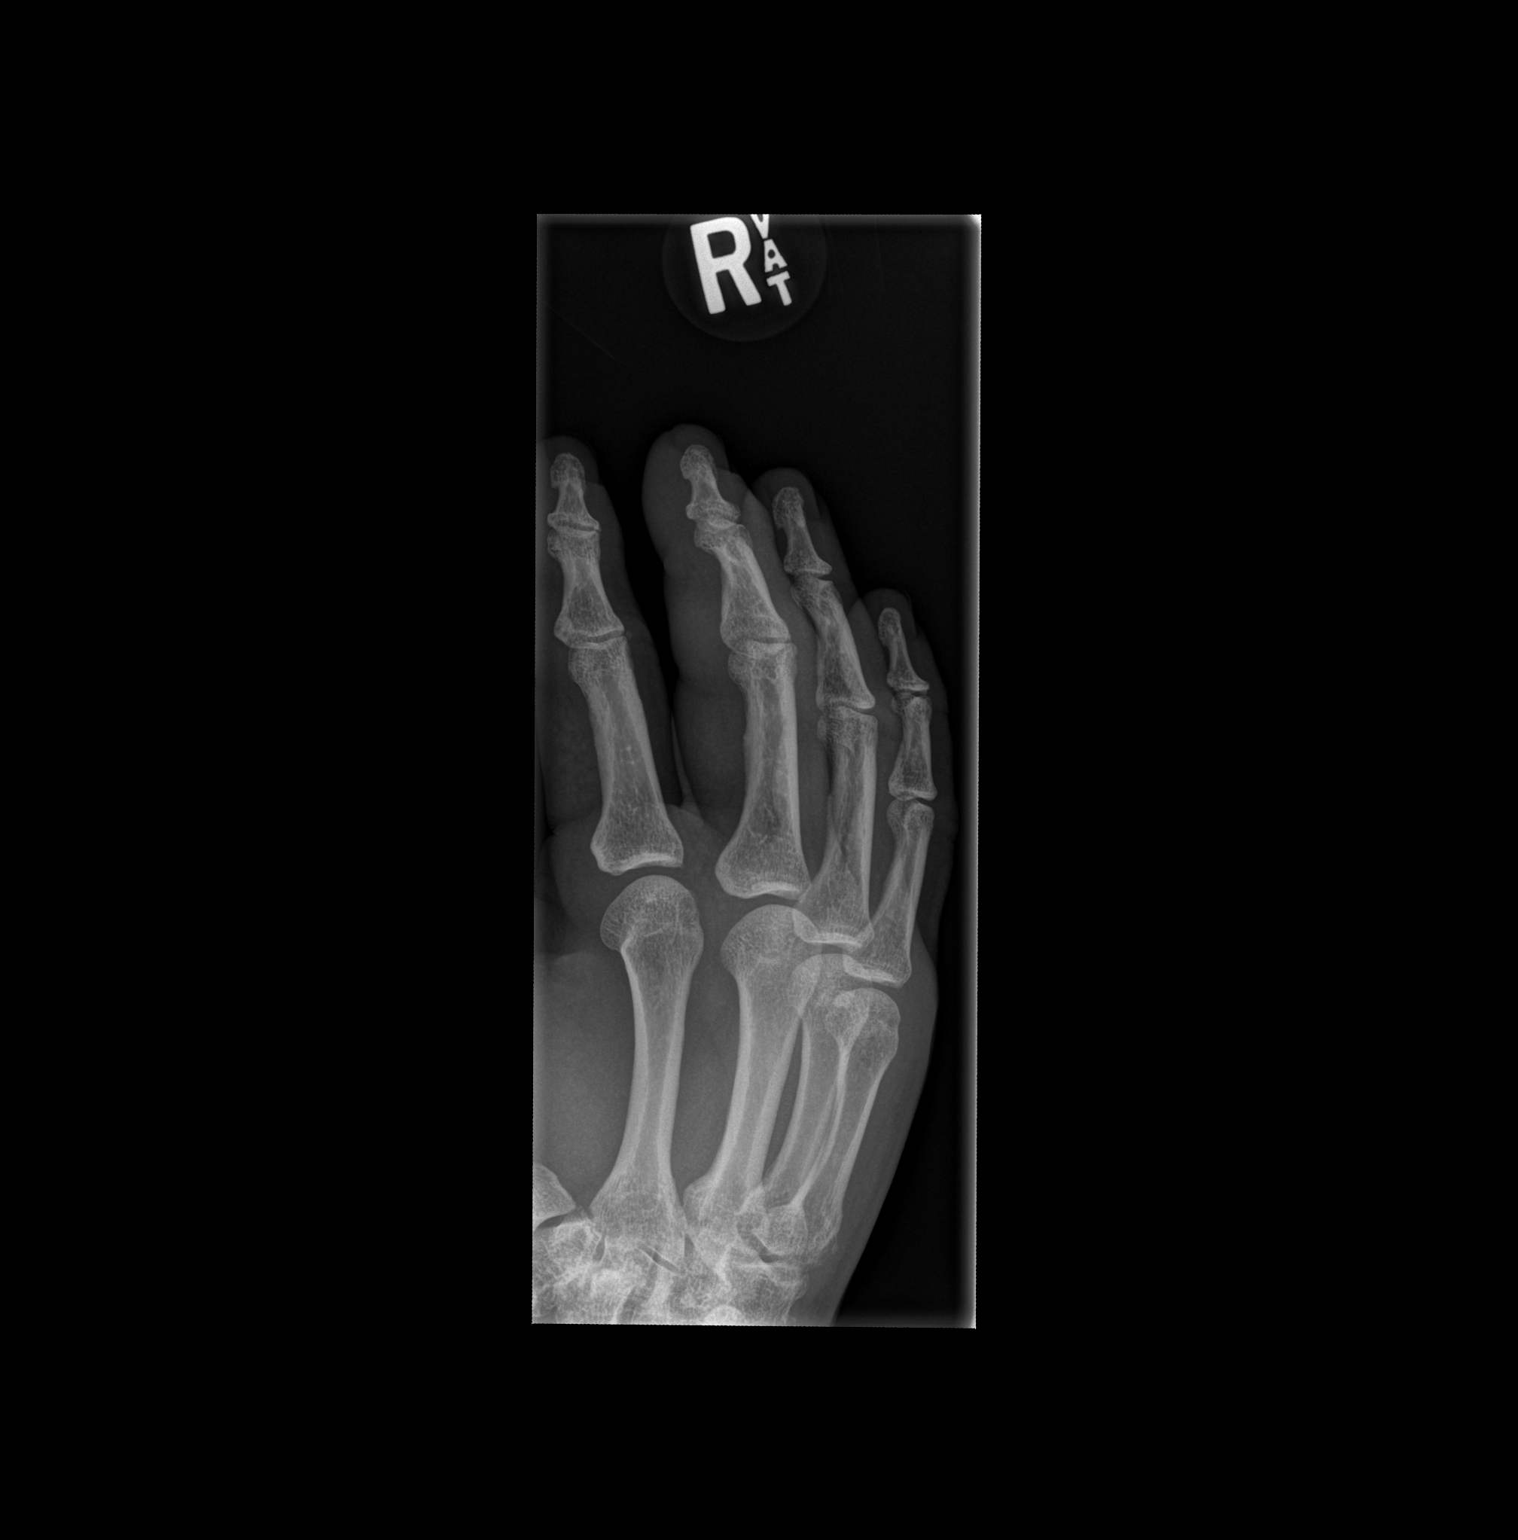

[x finger lat right]
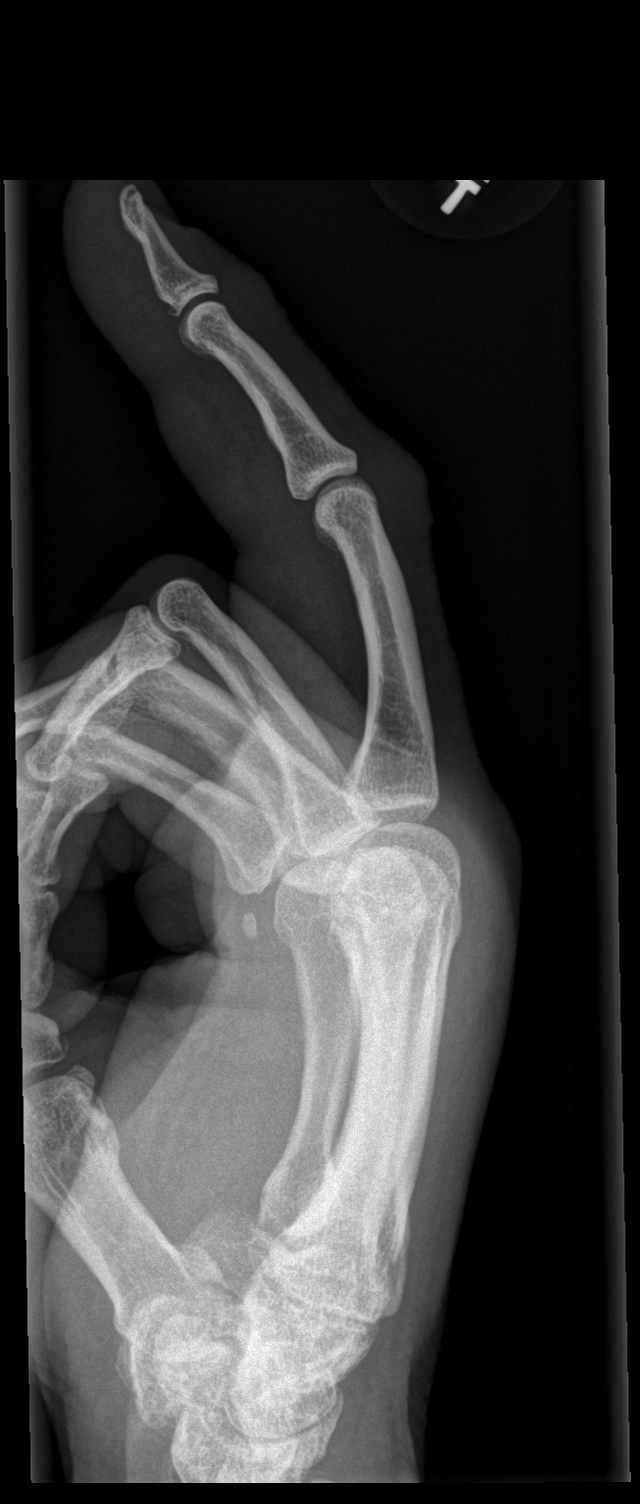

[3 of 3 positions shown; findings below may reference images not displayed]

FINDINGS: No fracture or bone lesion.

Joints are normally spaced and aligned.

There are no areas of bone resorption to suggest osteomyelitis.

There is soft tissue swelling throughout the middle finger most
evident along the medial margin of the finger at the PIP joint
level. No soft tissue air or radiopaque foreign body.
IMPRESSION: 1. No fracture, bone lesion or evidence of osteomyelitis.
2. Soft tissue swelling with no soft tissue air.

## 2021-09-30 ENCOUNTER — Other Ambulatory Visit: Payer: Self-pay

## 2021-09-30 ENCOUNTER — Ambulatory Visit
Admission: RE | Admit: 2021-09-30 | Discharge: 2021-09-30 | Disposition: A | Discharge status: Home / Self Care | Payer: Medicare Other | Source: Ambulatory Visit | Attending: Family Medicine | Admitting: Family Medicine

## 2021-09-30 ENCOUNTER — Other Ambulatory Visit: Payer: Self-pay | Admitting: Family Medicine

## 2021-09-30 DIAGNOSIS — R059 Cough, unspecified: Secondary | ICD-10-CM

## 2022-09-15 ENCOUNTER — Emergency Department (HOSPITAL_COMMUNITY): Payer: Medicare Other

## 2022-09-15 ENCOUNTER — Emergency Department (HOSPITAL_COMMUNITY)
Admission: EM | Admit: 2022-09-15 | Discharge: 2022-09-15 | Discharge status: Against Medical Advice | Payer: Medicare Other | Attending: Emergency Medicine | Admitting: Emergency Medicine

## 2022-09-15 DIAGNOSIS — N132 Hydronephrosis with renal and ureteral calculous obstruction: Secondary | ICD-10-CM | POA: Diagnosis not present

## 2022-09-15 DIAGNOSIS — R109 Unspecified abdominal pain: Secondary | ICD-10-CM | POA: Diagnosis present

## 2022-09-15 LAB — LIPASE, BLOOD: Lipase: 26 U/L (ref 11–51)

## 2022-09-15 LAB — CBC WITH DIFFERENTIAL/PLATELET
Abs Immature Granulocytes: 0.03 10*3/uL (ref 0.00–0.07)
Basophils Absolute: 0 10*3/uL (ref 0.0–0.1)
Basophils Relative: 0 %
Eosinophils Absolute: 0.1 10*3/uL (ref 0.0–0.5)
Eosinophils Relative: 1 %
HCT: 40.7 % (ref 39.0–52.0)
Hemoglobin: 13.6 g/dL (ref 13.0–17.0)
Immature Granulocytes: 0 %
Lymphocytes Relative: 16 %
Lymphs Abs: 1.7 10*3/uL (ref 0.7–4.0)
MCH: 30.8 pg (ref 26.0–34.0)
MCHC: 33.4 g/dL (ref 30.0–36.0)
MCV: 92.1 fL (ref 80.0–100.0)
Monocytes Absolute: 0.8 10*3/uL (ref 0.1–1.0)
Monocytes Relative: 8 %
Neutro Abs: 7.9 10*3/uL — ABNORMAL HIGH (ref 1.7–7.7)
Neutrophils Relative %: 75 %
Platelets: 402 10*3/uL — ABNORMAL HIGH (ref 150–400)
RBC: 4.42 MIL/uL (ref 4.22–5.81)
RDW: 13 % (ref 11.5–15.5)
WBC: 10.7 10*3/uL — ABNORMAL HIGH (ref 4.0–10.5)
nRBC: 0 % (ref 0.0–0.2)

## 2022-09-15 LAB — COMPREHENSIVE METABOLIC PANEL
ALT: 21 U/L (ref 0–44)
AST: 21 U/L (ref 15–41)
Albumin: 4 g/dL (ref 3.5–5.0)
Alkaline Phosphatase: 65 U/L (ref 38–126)
Anion gap: 10 (ref 5–15)
BUN: 13 mg/dL (ref 8–23)
CO2: 21 mmol/L — ABNORMAL LOW (ref 22–32)
Calcium: 8.4 mg/dL — ABNORMAL LOW (ref 8.9–10.3)
Chloride: 108 mmol/L (ref 98–111)
Creatinine, Ser: 1.09 mg/dL (ref 0.61–1.24)
GFR, Estimated: >60 mL/min (ref >60)
Glucose, Bld: 107 mg/dL — ABNORMAL HIGH (ref 70–99)
Potassium: 3.5 mmol/L (ref 3.5–5.1)
Sodium: 139 mmol/L (ref 135–145)
Total Bilirubin: 0.5 mg/dL (ref 0.3–1.2)
Total Protein: 7 g/dL (ref 6.5–8.1)

## 2022-09-15 LAB — URINALYSIS, ROUTINE W REFLEX MICROSCOPIC
Bacteria, UA: NONE SEEN
Bilirubin Urine: NEGATIVE
Glucose, UA: NEGATIVE mg/dL
Hgb urine dipstick: NEGATIVE
Ketones, ur: NEGATIVE mg/dL
Leukocytes,Ua: NEGATIVE
Nitrite: NEGATIVE
Protein, ur: NEGATIVE mg/dL
Specific Gravity, Urine: 1.039 — ABNORMAL HIGH (ref 1.005–1.030)
pH: 5 (ref 5.0–8.0)

## 2022-09-15 MED ORDER — KETOROLAC TROMETHAMINE 15 MG/ML IJ SOLN
15.0000 mg | Freq: Once | INTRAMUSCULAR | Status: AC
  Administered 2022-09-15: 15 mg via INTRAMUSCULAR

## 2022-09-15 MED ORDER — ONDANSETRON 4 MG PO TBDP
4.0000 mg | ORAL_TABLET | Freq: Once | ORAL | Status: AC
  Administered 2022-09-15: 4 mg via ORAL
  Filled 2022-09-15: qty 1

## 2022-09-15 MED ORDER — KETOROLAC TROMETHAMINE 15 MG/ML IJ SOLN
15.0000 mg | Freq: Once | INTRAMUSCULAR | Status: DC
  Filled 2022-09-15: qty 1

## 2022-09-15 NOTE — ED Provider Triage Note (Cosign Needed)
Emergency Medicine Provider Triage Evaluation Note  Joshua Shelton , a 67 y.o. male  was evaluated in triage.  Pt complains of worsening left flank pain and nausea.  Patient states that he was diagnosed with a 10 mm left ureteral stone last week and has had worsening pain over the last couple days.  States that he has had multiple kidney stones in the past but has always been able to pass them on his own never required any interventions.  He is on oxycodone 15 mg at home for chronic pain as well as Flomax but this is not helping.  States that he had an outpatient CT renal study as follow-up today at [facility], however, I am unable to view this through epic results are through care everywhere.  Patient states that he is agreeable to receiving a repeat scan if we are unable to view this in the system as his pain is significantly worse.  He states he is still able to urinate normally and has no dysuria, gross hematuria, fever, or chills.  Denies any history of chronic kidney disease.  Review of Systems  Positive: See HPI Negative: See HPI  Physical Exam  BP (!) 179/95 (BP Location: Right Arm)   Pulse 76   Temp 97.6 F (36.4 C) (Oral)   Resp 18   SpO2 96%  Gen:   Awake, moderate distress secondary to pain Resp:  Normal effort  MSK:   Moves extremities without difficulty, no lower extremity edema Other:  Abdomen soft, left lower quadrant and left CVA tenderness  Medical Decision Making  Medically screening exam initiated at 4:48 PM.  Appropriate orders placed.  Juanda Crumble was informed that the remainder of the evaluation will be completed by another provider, this initial triage assessment does not replace that evaluation, and the importance of remaining in the ED until their evaluation is complete.     Suzzette Righter, PA-C 09/15/22 1651

## 2022-09-15 NOTE — ED Provider Notes (Signed)
[place] [facility]GENC[facility] DEPARTMENT AT [facility] Provider Note   CSN: [id number] Arrival date & time: 09/15/22  1628     History  Chief Complaint  Patient presents with   Flank Pain    Joshua Shelton is a 67 y.o. male.   Flank Pain     Pt presents to the ED with complaints of persistent flank pain associated with a recently diagnosed kidney stone.  Patient states he had a evaluation at another [facility] and was diagnosed with a 10 mm left ureteral stone.  Patient states in the last few days he has had worsening pain.  Patient states he was supposed to get referred to a urologist.  He is on chronic oxycodone for pain and has been taking Flomax but continues to have pain and discomfort.  Patient denies any fevers or chills.  No vomiting or diarrhea.  Home Medications Prior to Admission medications   Medication Sig Start Date End Date Taking? Authorizing Provider  acetaminophen (TYLENOL) 650 MG CR tablet Take 650 mg by mouth every 8 (eight) hours.    [provider]  naproxen sodium (ALEVE) 220 MG tablet Take 220 mg by mouth every 8 (eight) hours.    [provider]  Oxycodone HCl 10 MG TABS Take 10 mg by mouth every 8 (eight) hours.  02/09/19   [provider]  pregabalin (LYRICA) 50 MG capsule Take 50 mg by mouth every 8 (eight) hours. 01/08/19   [provider]  tiZANidine (ZANAFLEX) 4 MG tablet Take 8 mg by mouth every 8 (eight) hours.  02/01/19   [provider]      Allergies    Patient has no known allergies.    Review of Systems   Review of Systems  Genitourinary:  Positive for flank pain.    Physical Exam Updated Vital Signs BP (!) 136/94 (BP Location: Left Arm)   Pulse 72   Temp 98.1 F (36.7 C) (Oral)   Resp 18   SpO2 97%  Physical Exam Vitals and nursing note reviewed.  Constitutional:      General: He is not in acute distress.    Appearance: He is well-developed.  HENT:     Head: Normocephalic and  atraumatic.     Right Ear: External ear normal.     Left Ear: External ear normal.  Eyes:     General: No scleral icterus.       Right eye: No discharge.        Left eye: No discharge.     Conjunctiva/sclera: Conjunctivae normal.  Neck:     Trachea: No tracheal deviation.  Cardiovascular:     Rate and Rhythm: Normal rate and regular rhythm.  Pulmonary:     Effort: Pulmonary effort is normal. No respiratory distress.     Breath sounds: Normal breath sounds. No stridor. No wheezing or rales.  Abdominal:     General: Bowel sounds are normal. There is no distension.     Palpations: Abdomen is soft.     Tenderness: There is no abdominal tenderness. There is no guarding or rebound.  Musculoskeletal:        General: No tenderness or deformity.     Cervical back: Neck supple.  Skin:    General: Skin is warm and dry.     Findings: No rash.  Neurological:     General: No focal deficit present.     Mental Status: He is alert.     Cranial Nerves:  No cranial nerve deficit, dysarthria or facial asymmetry.     Sensory: No sensory deficit.     Motor: No abnormal muscle tone or seizure activity.     Coordination: Coordination normal.  Psychiatric:        Mood and Affect: Mood normal.     ED Results / Procedures / Treatments   Labs (all labs ordered are listed, but only abnormal results are displayed) Labs Reviewed  CBC WITH DIFFERENTIAL/PLATELET - Abnormal; Notable for the following components:      Result Value   WBC 10.7 (*)    Platelets 402 (*)    Neutro Abs 7.9 (*)    All other components within normal limits  COMPREHENSIVE METABOLIC PANEL - Abnormal; Notable for the following components:   CO2 21 (*)    Glucose, Bld 107 (*)    Calcium 8.4 (*)    All other components within normal limits  LIPASE, BLOOD  URINALYSIS, ROUTINE W REFLEX MICROSCOPIC    EKG None  Radiology CT RENAL STONE STUDY  Result Date: 09/15/2022 CLINICAL DATA:  Left flank pain and nausea EXAM: CT  ABDOMEN AND PELVIS WITHOUT CONTRAST TECHNIQUE: Multidetector CT imaging of the abdomen and pelvis was performed following the standard protocol without IV contrast. RADIATION DOSE REDUCTION: This exam was performed according to the departmental dose-optimization program which includes automated exposure control, adjustment of the mA and/or kV according to patient size and/or use of iterative reconstruction technique. COMPARISON:  None Available. FINDINGS: Lower chest: Mild dependent and subsegmental atelectasis in the lower lobes. Hepatobiliary: Unremarkable Pancreas: Moderate parenchymal atrophy. Stone in the dorsal pancreatic duct in the pancreatic tail. Spleen: Unremarkable Adrenals/Urinary Tract: Both adrenal glands appear normal. Moderate left hydronephrosis and hydroureter due to a 1.0 by 0.6 by 0.8 cm proximal ureteral calculus at the vertical level of the lower margin of the left kidney, just past the left UPJ. There is periureteral stranding/fluid but no substantial hydroureter distal to this point. Additional 0.7 cm left kidney lower pole nonobstructive renal calculus. No other urinary tract calculi are identified. There is potential mild urinary bladder wall thickening. Assessment of the urinary bladder limited by streak artifact from the hip implant, mitigated by metal artifact reduction processing. Stomach/Bowel: Unremarkable.  Normal appendix. Vascular/Lymphatic: Unremarkable Reproductive: Unremarkable Other: No supplemental non-categorized findings. 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 Atrophic components of the hip adductor musculature although other portions are not as strikingly affected. Left hip prosthesis with protrusio and thinning of the left periacetabular bony structures, cerclage along the proximal femoral component of the implant. Grade  1 degenerative retrolisthesis at L2-3 and L3-4 with grade 1 degenerative anterolisthesis at L4-5. Loss of disc height at these levels with endplate sclerosis. Spondylosis and degenerative disc disease results in bilateral foraminal impingement at all levels between L2 and S1. IMPRESSION: 1. Moderate left hydronephrosis and hydroureter due to a 1.0 by 0.6 by 0.8 cm proximal ureteral calculus at the vertical level of the lower margin of the left kidney, just past the left UPJ. 2. Additional 0.7 cm left kidney lower pole nonobstructive renal calculus. 3. 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 Left hip prosthesis with protrusio and thinning of the left periacetabular bony structures. 4. Multilevel lumbar impingement. Electronically Signed   By: Van Clines M.D.   On: 09/15/2022 18:06    Procedures Procedures    Medications  Ordered in ED Medications  ondansetron (ZOFRAN-ODT) disintegrating tablet 4 mg (4 mg Oral Given 09/15/22 1656)  ketorolac (TORADOL) 15 MG/ML injection 15 mg (15 mg Intramuscular Given 09/15/22 1656)    ED Course/ Medical Decision Making/ A&P Clinical Course as of 09/15/22 2055  Wed Sep 15, 2022  1905 Discussed with [facility] urology.  [place] for outpatient management if no infection.  Call the office in the am if patient is comfortable. [JK]  1906 CBC with Differential(!) Cbc normal  [JK]  1906 Comprehensive metabolic panel(!) Metbaolic panel normal [JK]  2050  urinalysis still has not returned.  Patient has decided he does not want to wait any longer and is going to leave. I explained to him my concerns about infection and the urologist recommendations if he did have an infection.  patient states he certainly does not have an infection [JK]    Clinical Course User Index [JK] Dorie Rank, MD                             Medical Decision Making Problems Addressed: Ureteral stone with hydronephrosis: acute illness or injury that poses a threat  to life or bodily functions  Amount and/or Complexity of Data Reviewed Labs: ordered. Decision-making details documented in ED Course. Radiology: ordered and independent interpretation performed.   Presented to the ED for evaluation of flank pain associated with known ureteral stone.  Patient has not seen a urologist since his previous imaging test.  Patient had acute pain that brought him back to the ER today.  Patient was comfortable in the ED and did not require any pain medications.  He is on chronic opiates at home.  Patient CT scan did show a persistent large ureteral stone of that is not likely to pass.  I discussed the case with urology who recommended checking the urinalysis and as long as there is no infection patient can follow-up with their office closely.  Urinalysis was ordered however the patient was not able to give a sample initially.  Urine was sent off for urinalysis however that was still pending at the time the patient decided to leave.  Explained he was not interested in waiting around any longer.  He does not think he has an infection.  He will follow-up with urologist.  Patient was given referral information        Final Clinical Impression(s) / ED Diagnoses Final diagnoses:  Ureteral stone with hydronephrosis    Rx / DC Orders ED Discharge Orders     None         Dorie Rank, MD 09/15/22 2055

## 2022-09-15 NOTE — ED Notes (Signed)
Patient stated he was feeling better and was ready to go.  Patient educated on the need to have his urine results back and for the MD to discharge him.  Patient verbalized understanding.

## 2022-09-15 NOTE — ED Triage Notes (Signed)
Pt referred to ED for kidney stone with L flank pain. Pt states that pain has been increasing over the last two weeks. Pt already had CT done at [facility] outpatient today.

## 2022-09-15 NOTE — ED Notes (Signed)
Patient insisting on leaving, refused to sign AMA.  MD Tomi Bamberger at bedside explaining risks of leaving and return precautions. Patient verbalized understanding, seen leaving with a steady gait.

## 2022-09-15 NOTE — Discharge Instructions (Signed)
Follow-up with the urologist as we discussed.  Call tomorrow to schedule an appointment.  Return to emergency room if you start experiencing fever or worsening symptoms.

## 2022-09-16 ENCOUNTER — Other Ambulatory Visit: Payer: Self-pay | Admitting: Urology

## 2022-09-16 NOTE — Progress Notes (Signed)
Left voice mail tor pt to return call for instructions

## 2022-09-17 ENCOUNTER — Encounter (HOSPITAL_BASED_OUTPATIENT_CLINIC_OR_DEPARTMENT_OTHER): Payer: Self-pay | Admitting: Urology

## 2022-09-17 NOTE — Progress Notes (Signed)
Talked with patient. Instructions given. Hx and meds reviewed. Arrival time 0800. NPO after MN. Driver will be a family member. No etoh on Sunday

## 2022-09-20 ENCOUNTER — Other Ambulatory Visit: Payer: Self-pay

## 2022-09-20 ENCOUNTER — Encounter (HOSPITAL_BASED_OUTPATIENT_CLINIC_OR_DEPARTMENT_OTHER)
Admission: RE | Disposition: A | Discharge status: Home / Self Care | Payer: Self-pay | Source: Ambulatory Visit | Attending: Urology

## 2022-09-20 ENCOUNTER — Ambulatory Visit (HOSPITAL_BASED_OUTPATIENT_CLINIC_OR_DEPARTMENT_OTHER)
Admission: RE | Admit: 2022-09-20 | Discharge: 2022-09-20 | Disposition: A | Discharge status: Home / Self Care | Payer: Medicare Other | Source: Ambulatory Visit | Attending: Urology | Admitting: Urology

## 2022-09-20 ENCOUNTER — Encounter (HOSPITAL_BASED_OUTPATIENT_CLINIC_OR_DEPARTMENT_OTHER): Payer: Self-pay | Admitting: Urology

## 2022-09-20 ENCOUNTER — Ambulatory Visit (HOSPITAL_COMMUNITY): Payer: Medicare Other

## 2022-09-20 DIAGNOSIS — N201 Calculus of ureter: Secondary | ICD-10-CM

## 2022-09-20 DIAGNOSIS — K219 Gastro-esophageal reflux disease without esophagitis: Secondary | ICD-10-CM | POA: Insufficient documentation

## 2022-09-20 DIAGNOSIS — N132 Hydronephrosis with renal and ureteral calculous obstruction: Secondary | ICD-10-CM | POA: Insufficient documentation

## 2022-09-20 DIAGNOSIS — Z87442 Personal history of urinary calculi: Secondary | ICD-10-CM | POA: Insufficient documentation

## 2022-09-20 HISTORY — PX: EXTRACORPOREAL SHOCK WAVE LITHOTRIPSY: SHX1557

## 2022-09-20 HISTORY — DX: Gastro-esophageal reflux disease without esophagitis: K21.9

## 2022-09-20 HISTORY — DX: Unspecified osteoarthritis, unspecified site: M19.90

## 2022-09-20 HISTORY — DX: Dyspnea, unspecified: R06.00

## 2022-09-20 SURGERY — LITHOTRIPSY, ESWL
Anesthesia: LOCAL | Laterality: Left

## 2022-09-20 MED ORDER — CIPROFLOXACIN HCL 500 MG PO TABS
500.0000 mg | ORAL_TABLET | ORAL | Status: AC
  Administered 2022-09-20: 500 mg via ORAL

## 2022-09-20 MED ORDER — DIPHENHYDRAMINE HCL 25 MG PO CAPS
25.0000 mg | ORAL_CAPSULE | ORAL | Status: AC
  Administered 2022-09-20: 25 mg via ORAL

## 2022-09-20 MED ORDER — CIPROFLOXACIN HCL 500 MG PO TABS
ORAL_TABLET | ORAL | Status: AC
  Filled 2022-09-20: qty 1

## 2022-09-20 MED ORDER — DIPHENHYDRAMINE HCL 25 MG PO CAPS
ORAL_CAPSULE | ORAL | Status: AC
  Filled 2022-09-20: qty 1

## 2022-09-20 MED ORDER — TRAMADOL HCL 50 MG PO TABS: 50.0000 mg | ORAL_TABLET | Freq: Four times a day (QID) | ORAL | 0 refills | Status: AC | PRN

## 2022-09-20 MED ORDER — KETOROLAC TROMETHAMINE 30 MG/ML IJ SOLN
30.0000 mg | Freq: Once | INTRAMUSCULAR | Status: AC
  Administered 2022-09-20: 30 mg via INTRAVENOUS

## 2022-09-20 MED ORDER — SODIUM CHLORIDE 0.9 % IV SOLN: INTRAVENOUS | Status: DC

## 2022-09-20 MED ORDER — DIAZEPAM 5 MG PO TABS
ORAL_TABLET | ORAL | Status: AC
  Filled 2022-09-20: qty 2

## 2022-09-20 MED ORDER — DIAZEPAM 5 MG PO TABS
10.0000 mg | ORAL_TABLET | ORAL | Status: AC
  Administered 2022-09-20: 10 mg via ORAL

## 2022-09-20 MED ORDER — KETOROLAC TROMETHAMINE 30 MG/ML IJ SOLN
INTRAMUSCULAR | Status: AC
  Filled 2022-09-20: qty 1

## 2022-09-20 MED ORDER — DOCUSATE SODIUM 100 MG PO CAPS: 100.0000 mg | ORAL_CAPSULE | Freq: Every day | ORAL | 0 refills | Status: AC | PRN

## 2022-09-20 NOTE — Discharge Instructions (Signed)

## 2022-09-20 NOTE — Op Note (Signed)
[facility] Operative Note  Treating Physician: Rexene Alberts, MD  Pre-op diagnosis: Left proximal ureteral stone  Post-op diagnosis: Same   Procedure: Left ESWL  See Aris Everts OP note scanned into chart. Also because of the size, density, location and other factors that cannot be anticipated I feel this will likely be a staged procedure. This fact supersedes any indication in the scanned [place] stone operative note to the contrary.  [facility]  Pager: [phone]

## 2022-09-20 NOTE — H&P (Signed)
Office Visit Report     09/16/2022   --------------------------------------------------------------------------------   Joshua Shelton  MRN: [id number]  DOB: [birth year], 67 year old Male  PRIMARY CARE:  Zigmund Gottron. Arelia Sneddon, MD  PRIMARY CARE FAX:  [phone]  REFERRING:    PROVIDER:  Rexene Alberts, M.D.  LOCATION:  [facility] [phone]     --------------------------------------------------------------------------------   CC/HPI: Joshua Shelton is a 67 year old male who is seen in consultation today for left ureteral stone, left renal stone and left-sided flank pain.   1. Urolithiasis:  Pre-2024: He has a long history of urolithiasis including medical expulsive therapy times several. He denies prior surgical intervention for stones.  -He presented to the ED on 09/15/2022 with complaints of left-sided flank pain. He has had no sign infection with no fevers, chills, dysuria.  -CT A/P 09/15/2022 with moderate left hydronephrosis due to a 10 x 6 x 8 mm proximal left ureteral stone just past the left UPJ. There is additional 7 mm left lower pole stone identified.  -His pain is well-controlled today.   He has chronic pain managed with oxycodone 10 mg every 8 hours.     ALLERGIES: None   MEDICATIONS: Motrin Ib 200 mg capsule  Oxycodone Hcl 15 mg tablet  Trazodone Hcl 150 mg tablet  Tylenol 8 Hour 650 mg tablet, extended release     GU PSH: None   NON-GU PSH: Ankle Arthroscopy/surgery, Left Hip Arthroscopy/surgery Rotator cuff surgery, Bilateral     GU PMH: None   NON-GU PMH: Anxiety Arrhythmia Asthma Encounter for general adult medical examination without abnormal findings, Encounter for preventive health examination GERD Sleep Apnea    FAMILY HISTORY: 3 Son's - Runs in Family Kidney Stones - Father   SOCIAL HISTORY: Marital Status: Married Preferred Language: English; Ethnicity: Not Hispanic Or Latino; Race: White Current Smoking Status: Patient has  never smoked.   Tobacco Use Assessment Completed: Used Tobacco in last 30 days? Does drink.  Drinks 4+ caffeinated drinks per day.    REVIEW OF SYSTEMS:    GU Review Male:   Patient reports frequent urination, hard to postpone urination, get up at night to urinate, stream starts and stops, trouble starting your stream, have to strain to urinate , and erection problems. Patient denies leakage of urine and penile pain.  Gastrointestinal (Upper):   Patient reports nausea. Patient denies vomiting and indigestion/ heartburn.  Gastrointestinal (Lower):   Patient reports diarrhea and constipation.   Constitutional:   Patient reports fatigue. Patient denies fever, night sweats, and weight loss.  Skin:   Patient denies skin rash/ lesion and itching.  Eyes:   Patient denies blurred vision and double vision.  Ears/ Nose/ Throat:   Patient denies sore throat and sinus problems.  Hematologic/Lymphatic:   Patient denies swollen glands and easy bruising.  Cardiovascular:   Patient denies leg swelling and chest pains.  Respiratory:   Patient denies cough and shortness of breath.  Endocrine:   Patient denies excessive thirst.  Musculoskeletal:   Patient reports joint pain and back pain.   Neurological:   Patient reports headaches. Patient denies dizziness.  Psychologic:   Patient reports anxiety. Patient denies depression.   VITAL SIGNS:      09/16/2022 11:21 AM  Weight 163 lb / 73.94 kg  Height 65 in / 165.1 cm  BP 151/84 mmHg  Pulse 76 /min  Temperature 98.6 F / 37 C  BMI 27.1 kg/m   MULTI-SYSTEM PHYSICAL EXAMINATION:    Constitutional:  Well-nourished. No physical deformities. Normally developed. Good grooming.  Respiratory: No labored breathing, no use of accessory muscles.   Cardiovascular: Normal temperature, normal extremity pulses, no swelling, no varicosities.  Gastrointestinal: No mass, no tenderness, no rigidity, non obese abdomen.     Complexity of Data:  Source Of History:   Patient, Medical Record Summary  Records Review:   Previous Doctor Records, Previous Hospital Records, Previous Patient Records  Urine Test Review:   Urinalysis  X-Ray Review: C.T. Abdomen/Pelvis: Reviewed Films. Reviewed Report. Discussed With Patient.     12/11/03  Hormones  Testosterone, Total 3.28     PROCEDURES:         KUB - 74018  A single view of the abdomen is obtained.      . Patient confirmed No Neulasta OnPro Device.           Urinalysis w/Scope Dipstick Dipstick Cont'd Micro  Color: Yellow Bilirubin: Neg mg/dL WBC/hpf: NS (Not Seen)  Appearance: Clear Ketones: Neg mg/dL RBC/hpf: 3 - 10/hpf  Specific Gravity: 1.025 Blood: 3+ ery/uL Bacteria: NS (Not Seen)  pH: <=5.0 Protein: Neg mg/dL Cystals: Amorph Urates  Glucose: Neg mg/dL Urobilinogen: 0.2 mg/dL Casts: NS (Not Seen)    Nitrites: Neg Trichomonas: Not Present    Leukocyte Esterase: Neg leu/uL Mucous: Not Present      Epithelial Cells: NS (Not Seen)      Yeast: NS (Not Seen)      Sperm: Not Present         Ketoralac 33m - [id number], [id number] was prepped and cleaned. Ketoralac 660mwas injected IM in the right buttock. Zero wasted. A band-aid was applied. Pt tolerated procedure well.    Qty: 60 Adm. By: BrRene PaciUnit: mg Lot No [id number]: IM Exp. Date 10/08/2023  Freq: None Mfgr.:   Site: Right Buttock   ASSESSMENT:      ICD-10 Details  1 GU:   Renal calculus - N20.0   2   Ureteral calculus - N20.1   3   Flank Pain - R10.84    PLAN:            Medications New Meds: Percocet 5 mg-325 mg tablet 1 tablet PO Q 4 H PRN   #20  0 Refill(s)  [facility]  Address:  [id number]:  [phone]:  [phone]          Orders X-Rays: KUB. No Oral Contrast          Schedule         Document Letter(s):  Created for Patient: Clinical Summary         Notes:    1. Left renal and left ureteral  stone:  -Reviewed CT A/P 09/15/2022 with 10 x 6 x 8 mm proximal left ureteral stone as well as left lower pole 7 mm nonobstructing stone.  -We reviewed options for treatment including ureteroscopy and ESWL. KUB 09/16/2022 with large proximal left ureteral stone at the level of L3.  -He would like to proceed with ESWL. Discussed risk benefits including risk of persistent stone and Steinstrasse.  -Surgery letter sent   We discussed the options for management of kidney stones, including observation, ESWL, ureteroscopy with laser lithotripsy, and PCNL. The risks and benefits of each option were discussed.  For observation I described the risks which include but are not limited to silent  renal damage, life-threatening infection, need for emergent surgery, failure to pass stone, and pain.   ESWL: risks and benefits of ESWL were outlined including infection, bleeding, pain, steinstrasse, kidney injury, need for ancillary treatments, and global anesthesia risks including but not limited to CVA, MI, DVT, PE, pneumonia, and death.   Ureteroscopy: risks and benefits of ureteroscopy were outlined, including infection, bleeding, pain, temporary ureteral stent and associated stent bother, ureteral injury, ureteral stricture, need for ancillary treatments, and global anesthesia risks including but not limited to CVA, MI, DVT, PE, pneumonia, and death.   CC: Joshua Gower, MD          Next Appointment:      Next Appointment: 09/20/2022 10:00 AM    Appointment Type: Surgery     Location: [facility] [phone]    Provider: Rexene Alberts, M.D.    Reason for Visit: NE/OP (L) ESWL    Urology Preoperative H&P   Chief Complaint: Left proximal ureteral stone  History of Present Illness: Joshua Shelton is a 66 y.o. male with a left proximal ureteral stone here for left ESWL. Denies fevers, chills, dysuria.     Past Medical History:  Diagnosis Date   Arthritis    Dyspnea    GERD  (gastroesophageal reflux disease)    Polio     Past Surgical History:  Procedure Laterality Date   BACK SURGERY      Allergies: No Known Allergies  Family History  Problem Relation Age of Onset   Alzheimer's disease Father     Social History:  reports that he has never smoked. He has never used smokeless tobacco. He reports current alcohol use. He reports that he does not use drugs.  ROS: A complete review of systems was performed.  All systems are negative except for pertinent findings as noted.  Physical Exam:  Vital signs in last 24 hours: Temp:  [98 F (36.7 C)] 98 F (36.7 C) (02/12 0846) Pulse Rate:  [85] 85 (02/12 0846) Resp:  [18] 18 (02/12 0846) BP: (134)/(91) 134/91 (02/12 0846) SpO2:  [97 %] 97 % (02/12 0846) Weight:  [73.2 kg] 73.2 kg (02/12 0846) Constitutional:  Alert and oriented, No acute distress Cardiovascular: Regular rate and rhythm Respiratory: Normal respiratory effort, Lungs clear bilaterally GI: Abdomen is soft, nontender, nondistended, no abdominal masses GU: No CVA tenderness Lymphatic: No lymphadenopathy Neurologic: Grossly intact, no focal deficits Psychiatric: Normal mood and affect  Laboratory Data:  No results for input(s): "WBC", "HGB", "HCT", "PLT" in the last 72 hours.  No results for input(s): "NA", "K", "CL", "GLUCOSE", "BUN", "CALCIUM", "CREATININE" in the last 72 hours.  Invalid input(s): "CO3"   No results found for this or any previous visit (from the past 24 hour(s)). No results found for this or any previous visit (from the past 240 hour(s)).  Renal Function: Recent Labs    09/15/22 1658  CREATININE 1.09   Estimated Creatinine Clearance: 58 mL/min (by C-G formula based on SCr of 1.09 mg/dL).  Radiologic Imaging: DG Abd 1 View  Result Date: 09/20/2022 CLINICAL DATA:  LEFT ureteral stone preop. EXAM: ABDOMEN - 1 VIEW COMPARISON:  09/16/2022 FINDINGS: Bowel gas pattern is nonobstructive. Within the LEFT UPPER  QUADRANT, there is a calcific density measuring 1 centimeter. More LATERAL and superior to this calculus, smaller calculi measure up to 2 millimeters. Prior LEFT hip arthroplasty. Degenerative changes are seen in the spine. A rectangular opacity overlies the inferior aspect of the sacrum, likely artifactual, external  to the patient. IMPRESSION: Intrarenal calculi. Stable appearance of 1 centimeter calculus overlying proximal LEFT ureter. Electronically Signed   By: Nolon Nations M.D.   On: 09/20/2022 08:53    I independently reviewed the above imaging studies.  Assessment and Plan Joshua Shelton is a 67 y.o. male with left proximal ureteral stone here for left ESWL.   The risks, benefits and alternatives of left ESWL was discussed with the patient. I described the risks which include arrhythmia, kidney contusion, kidney hemorrhage, need for transfusion, back discomfort, flank ecchymosis, flank abrasion, inability to fracture the stone, inability to pass stone fragments, Steinstrasse, infection associated with obstructing stones, need for an alternative surgical procedure and possible need for repeat shockwave lithotripsy.  The patient voices understanding and wishes to proceed.       Matt R. Lakeisha Waldrop MD 09/20/2022, 10:10 AM  [facility] Pager: [phone]): [phone]

## 2022-09-21 ENCOUNTER — Encounter (HOSPITAL_BASED_OUTPATIENT_CLINIC_OR_DEPARTMENT_OTHER): Payer: Self-pay | Admitting: Urology

## 2023-05-11 ENCOUNTER — Encounter: Payer: Self-pay | Admitting: Neurology

## 2023-05-11 ENCOUNTER — Ambulatory Visit: Payer: Medicare Other | Admitting: Neurology

## 2023-05-11 VITALS — BP 137/80 | HR 71 | Ht 64.0 in | Wt 158.0 lb

## 2023-05-11 DIAGNOSIS — R404 Transient alteration of awareness: Secondary | ICD-10-CM | POA: Diagnosis not present

## 2023-05-11 DIAGNOSIS — G14 Postpolio syndrome: Secondary | ICD-10-CM

## 2023-05-11 DIAGNOSIS — R269 Unspecified abnormalities of gait and mobility: Secondary | ICD-10-CM | POA: Diagnosis not present

## 2023-05-11 DIAGNOSIS — G894 Chronic pain syndrome: Secondary | ICD-10-CM

## 2023-05-11 DIAGNOSIS — G629 Polyneuropathy, unspecified: Secondary | ICD-10-CM

## 2023-05-11 MED ORDER — DULOXETINE HCL 60 MG PO CPEP: 60.0000 mg | ORAL_CAPSULE | Freq: Every day | ORAL | 11 refills | Status: AC

## 2023-05-11 NOTE — Progress Notes (Signed)
[facility]  PATIENT: Joshua Shelton DOB: Jan 12, 1956  REFERRING DOCTOR OR PCP:  Windle Guard, MD SOURCE: patiet, notes from PCP, imaging results  _________________________________   HISTORICAL  CHIEF COMPLAINT:  Chief Complaint  Patient presents with   Room 11    Pt is here Alone. Pt states that his blackout spells will happen when he is driving. Pt states that when he has his blackout spell he is not aware of what is going on and that is where the confusion comes from. 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 Pt states that he has fallen a lot. Pt had fallen yesterday.      HISTORY OF PRESENT ILLNESS:  I had the pleasure of seeing your patient, Joshua Shelton, at [facility] for neurologic consultation regarding his blackout spells.  He reports daily spells lasting a few minutes up to an hour.  He can be driving, walking or doing chores.   He continues t do the activity but has no memory of having done so.  He has no MVA accidents.    He feels he just blacks out.  He sometimes has people with him and they tell him he is staring blankly.   He notes he has pain while the episode is occurring.   He gets an Marine scientist sensation at times.    He has had convulsions in the past but not with the current episodes.   He does not think he has ever had an EEG.     He reports having stress but denies depression.  He has mood swings. He does not trust family members.    He notes some anxiety at times.   He does not sleep well, sometimes just 3 hours.    He does not know if he snores.      He also reports numbness in his feet.  He reports that this has worsened over the past couple of years but stays in his feet.   He has post-polio (mostly left leg), carpal tunnel syndrome.  He reports chronic pain everywhere and notes arthritis.   He has had many spine and hip operations.  Due to polio at age 39, the left leg has atrophy and  is a couple inches shorter than the right  REVIEW OF SYSTEMS: Constitutional: No fevers, chills, sweats, or change in appetite Eyes: No visual changes, double vision, eye pain Ear, nose and throat: No hearing loss, ear pain, nasal congestion, sore throat Cardiovascular: No chest pain, palpitations Respiratory:  No shortness of breath at rest or with exertion.   No wheezes GastrointestinaI: No nausea, vomiting, diarrhea, abdominal pain, fecal incontinence Genitourinary:  No dysuria, urinary retention or frequency.  No nocturia. Musculoskeletal:  No neck pain, back pain Integumentary: No rash, pruritus, skin lesions Neurological: as above Psychiatric: No depression at this time.  No anxiety Endocrine: No palpitations, diaphoresis, change in appetite, change in weigh or increased thirst Hematologic/Lymphatic:  No anemia, purpura, petechiae. Allergic/Immunologic: No itchy/runny eyes, nasal congestion, recent allergic reactions, rashes  ALLERGIES: No Known Allergies  HOME MEDICATIONS:  Current Outpatient Medications:    acetaminophen (TYLENOL) 650 MG CR tablet, Take 650 mg by mouth every 8 (eight) hours., Disp: , Rfl:    ibuprofen (ADVIL) 400 MG tablet, Take 400 mg by mouth every 6 (six) hours as needed., Disp: , Rfl:    Oxycodone HCl 10 MG TABS, Take 10 mg by mouth every 8 (eight) hours. ,  Disp: , Rfl:    docusate sodium (COLACE) 100 MG capsule, Take 1 capsule (100 mg total) by mouth daily as needed for up to 30 doses. (Patient not taking: Reported on 05/11/2023), Disp: 30 capsule, Rfl: 0   naproxen sodium (ALEVE) 220 MG tablet, Take 220 mg by mouth every 8 (eight) hours. (Patient not taking: Reported on 05/11/2023), Disp: , Rfl:    tamsulosin (FLOMAX) 0.4 MG CAPS capsule, Take 0.4 mg by mouth daily after supper. (Patient not taking: Reported on 05/11/2023), Disp: , Rfl:    traMADol (ULTRAM) 50 MG tablet, Take 1 tablet (50 mg total) by mouth every 6 (six) hours as needed for up to 20 doses.  (Patient not taking: Reported on 05/11/2023), Disp: 20 tablet, Rfl: 0  PAST MEDICAL HISTORY: Past Medical History:  Diagnosis Date   Arthritis    Dyspnea    GERD (gastroesophageal reflux disease)    Polio     PAST SURGICAL HISTORY: Past Surgical History:  Procedure Laterality Date   BACK SURGERY     EXTRACORPOREAL SHOCK WAVE LITHOTRIPSY Left 09/20/2022   Procedure: LEFT EXTRACORPOREAL SHOCK WAVE LITHOTRIPSY (ESWL);  Surgeon: Jannifer Hick, MD;  Location: [facility];  Service: Urology;  Laterality: Left;  75 MINUTES NEEDED FOR CASE    FAMILY HISTORY: Family History  Problem Relation Age of Onset   Alzheimer's disease Father     SOCIAL HISTORY: Social History   Socioeconomic History   Marital status: Married    Spouse name: Not on file   Number of children: Not on file   Years of education: Not on file   Highest education level: Not on file  Occupational History   Not on file  Tobacco Use   Smoking status: Never   Smokeless tobacco: Never  Substance and Sexual Activity   Alcohol use: Yes    Comment: occasionally   Drug use: Never   Sexual activity: Not on file  Other Topics Concern   Not on file  Social History Narrative   Not on file   Social Determinants of Health   Financial Resource Strain: Not on file  Food Insecurity: Not on file  Transportation Needs: Not on file  Physical Activity: Not on file  Stress: Not on file  Social Connections: Unknown (09/10/2022)   [facility] from [facility]   Social Network    Social Network: Not on file  Intimate Partner Violence: Unknown (09/10/2022)   Received from [facility]   HITS    Physically Hurt: Not on file    Insult or Talk Down To: Not on file    Threaten Physical Harm: Not on file    Scream or Curse: Not on file       PHYSICAL EXAM  Vitals:   05/11/23 1014  BP: 137/80  Pulse: 71  Weight: 158 lb (71.7 kg)  Height: 5\' 4"  (1.626 m)    Body mass index is 27.12  kg/m.   General: The patient is well-developed and well-nourished and in no acute distress  HEENT:  Head is [place]/AT.  Sclera are anicteric.    Neck: No carotid bruits are noted.  T reduced range of motion in neck.  Mild tenderness in the neck Cardiovascular: The heart has a regular rate and rhythm with a normal S1 and S2. There were no murmurs, gallops or rubs.    Skin: Extremities are without rash or  edema.  Musculoskeletal:  Back is mildly tender.  He has some tenderness over fibromyalgia  tender points of the back and neck the left leg is shorter than the right leg (he has postpolio  Neurologic Exam  Mental status: The patient is alert and oriented x 3 at the time of the examination. The patient has apparent normal recent and remote memory, with an apparently normal attention span and concentration ability.   Speech is normal.  Cranial nerves: Extraocular movements are full. Pupils are equal, round, and reactive to light and accomodation.  Visual fields are full.  Facial symmetry is present. There is good facial sensation to soft touch bilaterally.Facial strength is normal.  Trapezius and sternocleidomastoid strength is normal. No dysarthria is noted.  The tongue is midline, and the patient has symmetric elevation of the soft palate. No obvious hearing deficits are noted.  Motor: Atrophy of muscles of the left leg associated with his postpolio syndrome.  Tone is normal. Strength is  5 / 5 in both arms and the right leg and reduced in the left leg  Sensory: Sensory testing is intact to pinprick, soft touch and vibration sensation in the arms and proximal legs.  Vibration sensation was about 60% at the ankles and 20% at the toes.  Pinprick sensation was normal in the feet.  Coordination: Cerebellar testing reveals good finger-nose-finger and heel-to-shin bilaterally.  Gait and station: He uses his arms to stand up from the chair.  Appears stable.  He has a left foot drop and leg drop with  his walking.  He does better with a cane.  He cannot tandem walk.  Romberg is negative.   Reflexes: Deep tendon reflexes are symmetric and normal in the arms and absent in the legs..   Plantar responses are flexor.    DIAGNOSTIC DATA (LABS, IMAGING, TESTING) - I reviewed patient records, labs, notes, testing and imaging myself where available.  Lab Results  Component Value Date   WBC 10.7 (H) 09/15/2022   HGB 13.6 09/15/2022   HCT 40.7 09/15/2022   MCV 92.1 09/15/2022   PLT 402 (H) 09/15/2022      Component Value Date/Time   NA 139 09/15/2022 1658   K 3.5 09/15/2022 1658   CL 108 09/15/2022 1658   CO2 21 (L) 09/15/2022 1658   GLUCOSE 107 (H) 09/15/2022 1658   BUN 13 09/15/2022 1658   CREATININE 1.09 09/15/2022 1658   CALCIUM 8.4 (L) 09/15/2022 1658   PROT 7.0 09/15/2022 1658   ALBUMIN 4.0 09/15/2022 1658   AST 21 09/15/2022 1658   ALT 21 09/15/2022 1658   ALKPHOS 65 09/15/2022 1658   BILITOT 0.5 09/15/2022 1658   GFRNONAA >60 09/15/2022 1658   GFRAA >60 02/14/2019 0515   Lab Results  Component Value Date   CHOL 146 02/14/2019   HDL 36 (L) 02/14/2019   LDLCALC 97 02/14/2019   TRIG 66 02/14/2019   CHOLHDL 4.1 02/14/2019   Lab Results  Component Value Date   HGBA1C 5.5 02/14/2019   No results found for: "VITAMINB12" No results found for: "TSH"     ASSESSMENT AND PLAN  Episodic altered awareness  Post-poliomyelitis muscular atrophy  Gait disturbance  Chronic pain syndrome  In summary, Mr. Fromm is a 67 year old man with history of postpolio and chronic pain who reports daily episodes lasting minutes of altered awareness often with a blank stare.  If he is doing activity he continues to complete the task.  He will sometimes have some interaction during the episodes.  The etiology is uncertain.  I am most concerned that these represent  spells related to depression as he has a flat affect.  However we need to rule out seizure and I will check an EEG.  He has  an MRI scheduled for next week.  I will start him on duloxetine 60 mg.  This will hopefully help mood and chronic pain.  If symptoms worsen consider referring him to psychiatry for further evaluation.  Based on the results of the EEG he may require further follow-up or evaluation.  He could call if any major new or worsening neurologic symptoms.   Evellyn Tuff A. Epimenio Foot, MD, [facility] 05/11/2023, 10:45 AM Certified in Neurology, Clinical Neurophysiology, Sleep Medicine and Neuroimaging  [facility] [address], [address] [place], [place] [address] [phone]

## 2023-05-17 ENCOUNTER — Telehealth: Payer: Self-pay | Admitting: *Deleted

## 2023-05-17 LAB — MULTIPLE MYELOMA PANEL, SERUM
Albumin SerPl Elph-Mcnc: 3.9 g/dL (ref 2.9–4.4)
Albumin/Glob SerPl: 1.5 (ref 0.7–1.7)
Alpha 1: 0.2 g/dL (ref 0.0–0.4)
Alpha2 Glob SerPl Elph-Mcnc: 0.7 g/dL (ref 0.4–1.0)
B-Globulin SerPl Elph-Mcnc: 1.1 g/dL (ref 0.7–1.3)
Gamma Glob SerPl Elph-Mcnc: 0.7 g/dL (ref 0.4–1.8)
Globulin, Total: 2.7 g/dL (ref 2.2–3.9)
IgA/Immunoglobulin A, Serum: 274 mg/dL (ref 61–437)
IgG (Immunoglobin G), Serum: 838 mg/dL (ref 603–1613)
IgM (Immunoglobulin M), Srm: 53 mg/dL (ref 20–172)
Total Protein: 6.6 g/dL (ref 6.0–8.5)

## 2023-05-17 LAB — SJOGREN'S SYNDROME ANTIBODS(SSA + SSB)
ENA SSA (RO) Ab: <0.2 AI (ref 0.0–0.9)
ENA SSB (LA) Ab: <0.2 AI (ref 0.0–0.9)

## 2023-05-17 LAB — VITAMIN B12: Vitamin B-12: 662 pg/mL (ref 232–1245)

## 2023-05-17 NOTE — Telephone Encounter (Addendum)
LVM for pt to call.  Phone room: please let him know labs normal if he calls. Thank you

## 2023-05-17 NOTE — Telephone Encounter (Signed)
-----   Message from Joshua Shelton sent at 05/17/2023 12:59 PM EDT ----- Please let him know that the lab work is normal

## 2023-05-19 NOTE — Telephone Encounter (Signed)
Pt called back, pt informed with results.

## 2023-05-19 NOTE — Telephone Encounter (Signed)
Pt called and message from [place], [place] was relayed

## 2023-05-28 ENCOUNTER — Telehealth: Payer: Self-pay | Admitting: Neurology

## 2023-05-28 NOTE — Telephone Encounter (Signed)
The MRI of the brain was normal for age.  IMPRESSION:  1. No acute intracranial abnormality.  2.  Nonspecific mild white matter disease.   #  Brain: Several abnormal increased T2-weighted signal intensities present in the periventricular/subcortical white matter regions of both cerebral hemispheres. These are nonspecific.    #  Single tiny focus of susceptibility present in the RIGHT cerebellum. This is nonspecific and may represent a chronic blood products versus mineralization versus calcification.

## 2023-06-09 ENCOUNTER — Other Ambulatory Visit: Payer: Medicare Other | Admitting: *Deleted

## 2023-09-08 IMAGING — CR DG CHEST 2V
2 series · 2 of 2 positions shown · non-contrast
Comparison: August 16, 2018.

CLINICAL DATA: Productive cough.

EXAM:
CHEST - 2 VIEW

[w chest pa]
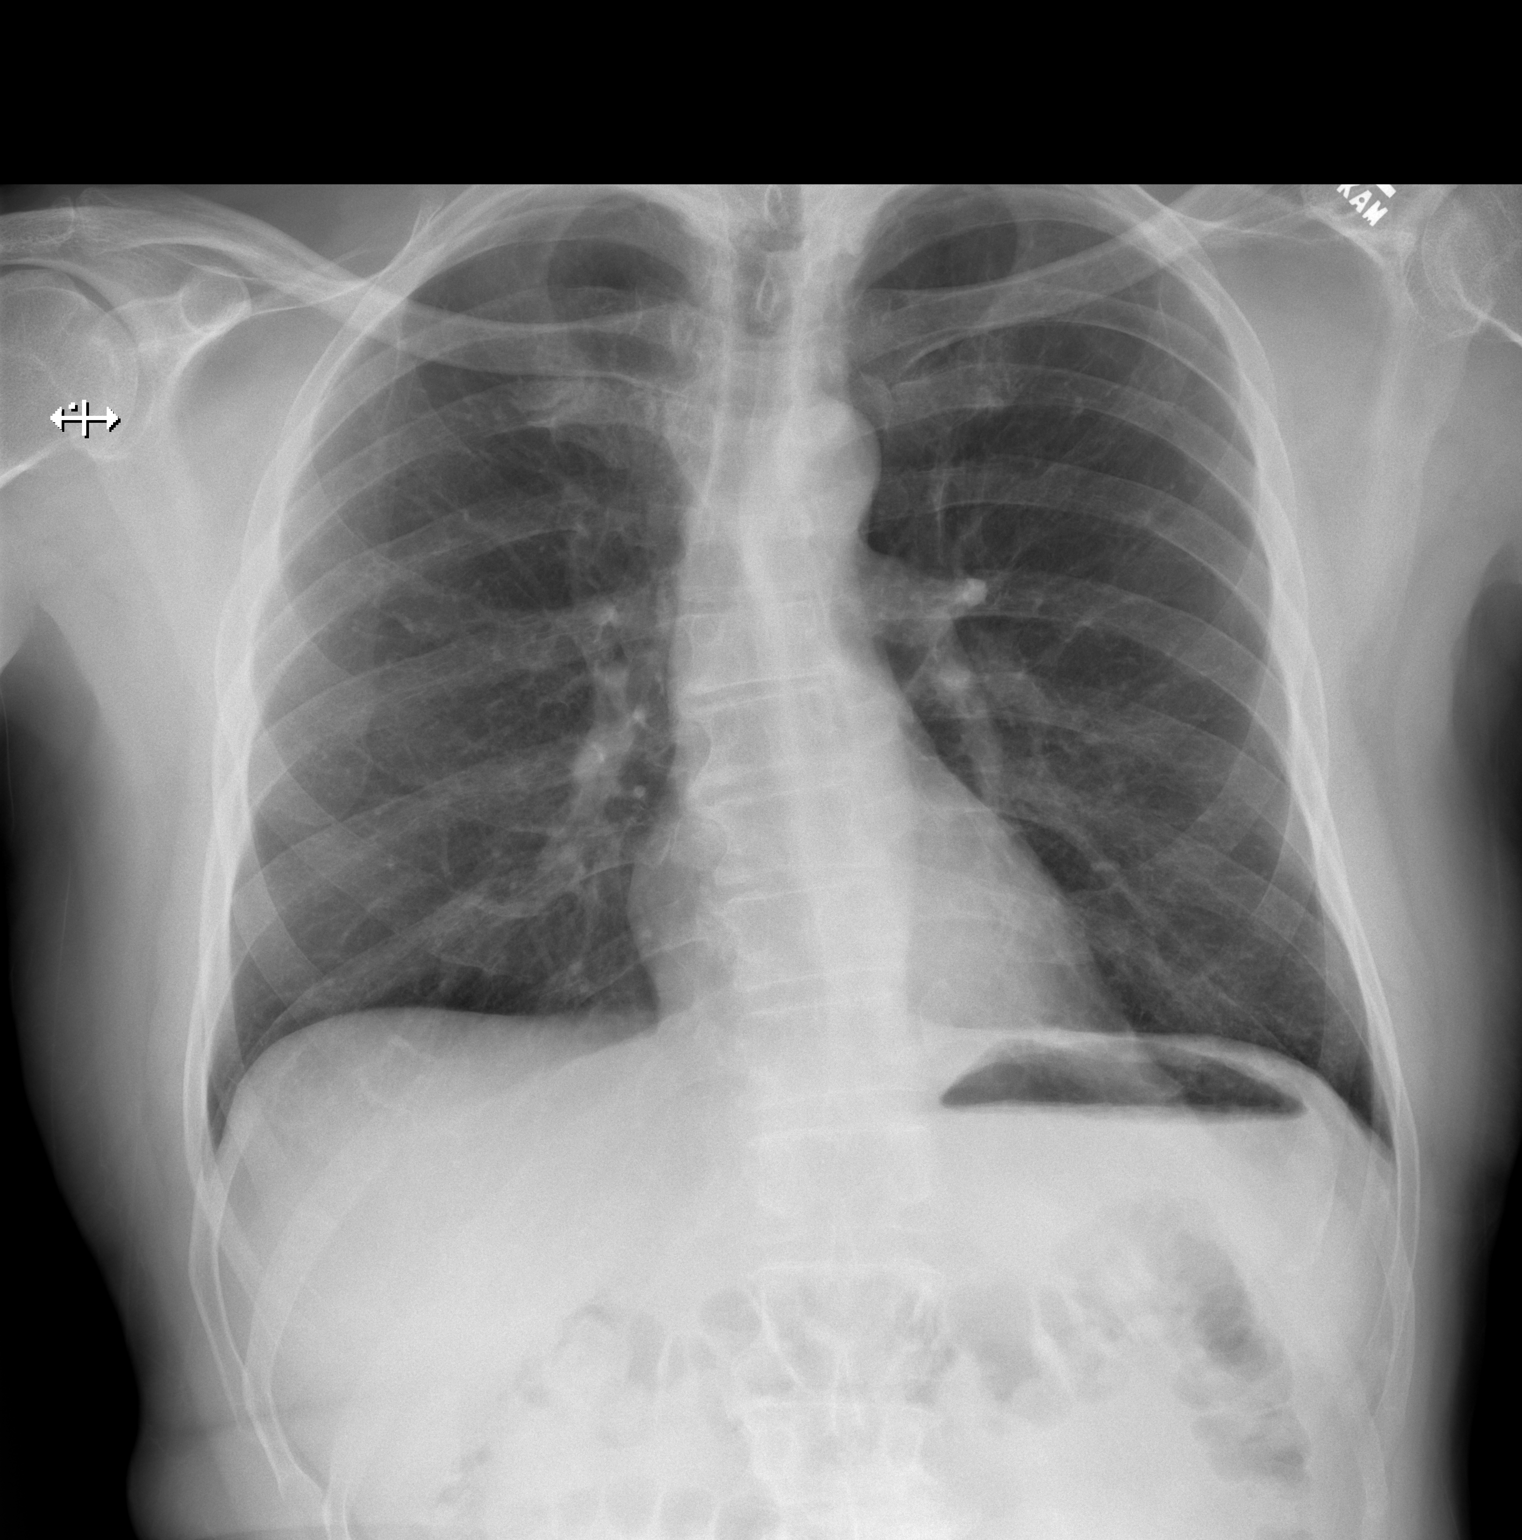

[w chest lat]
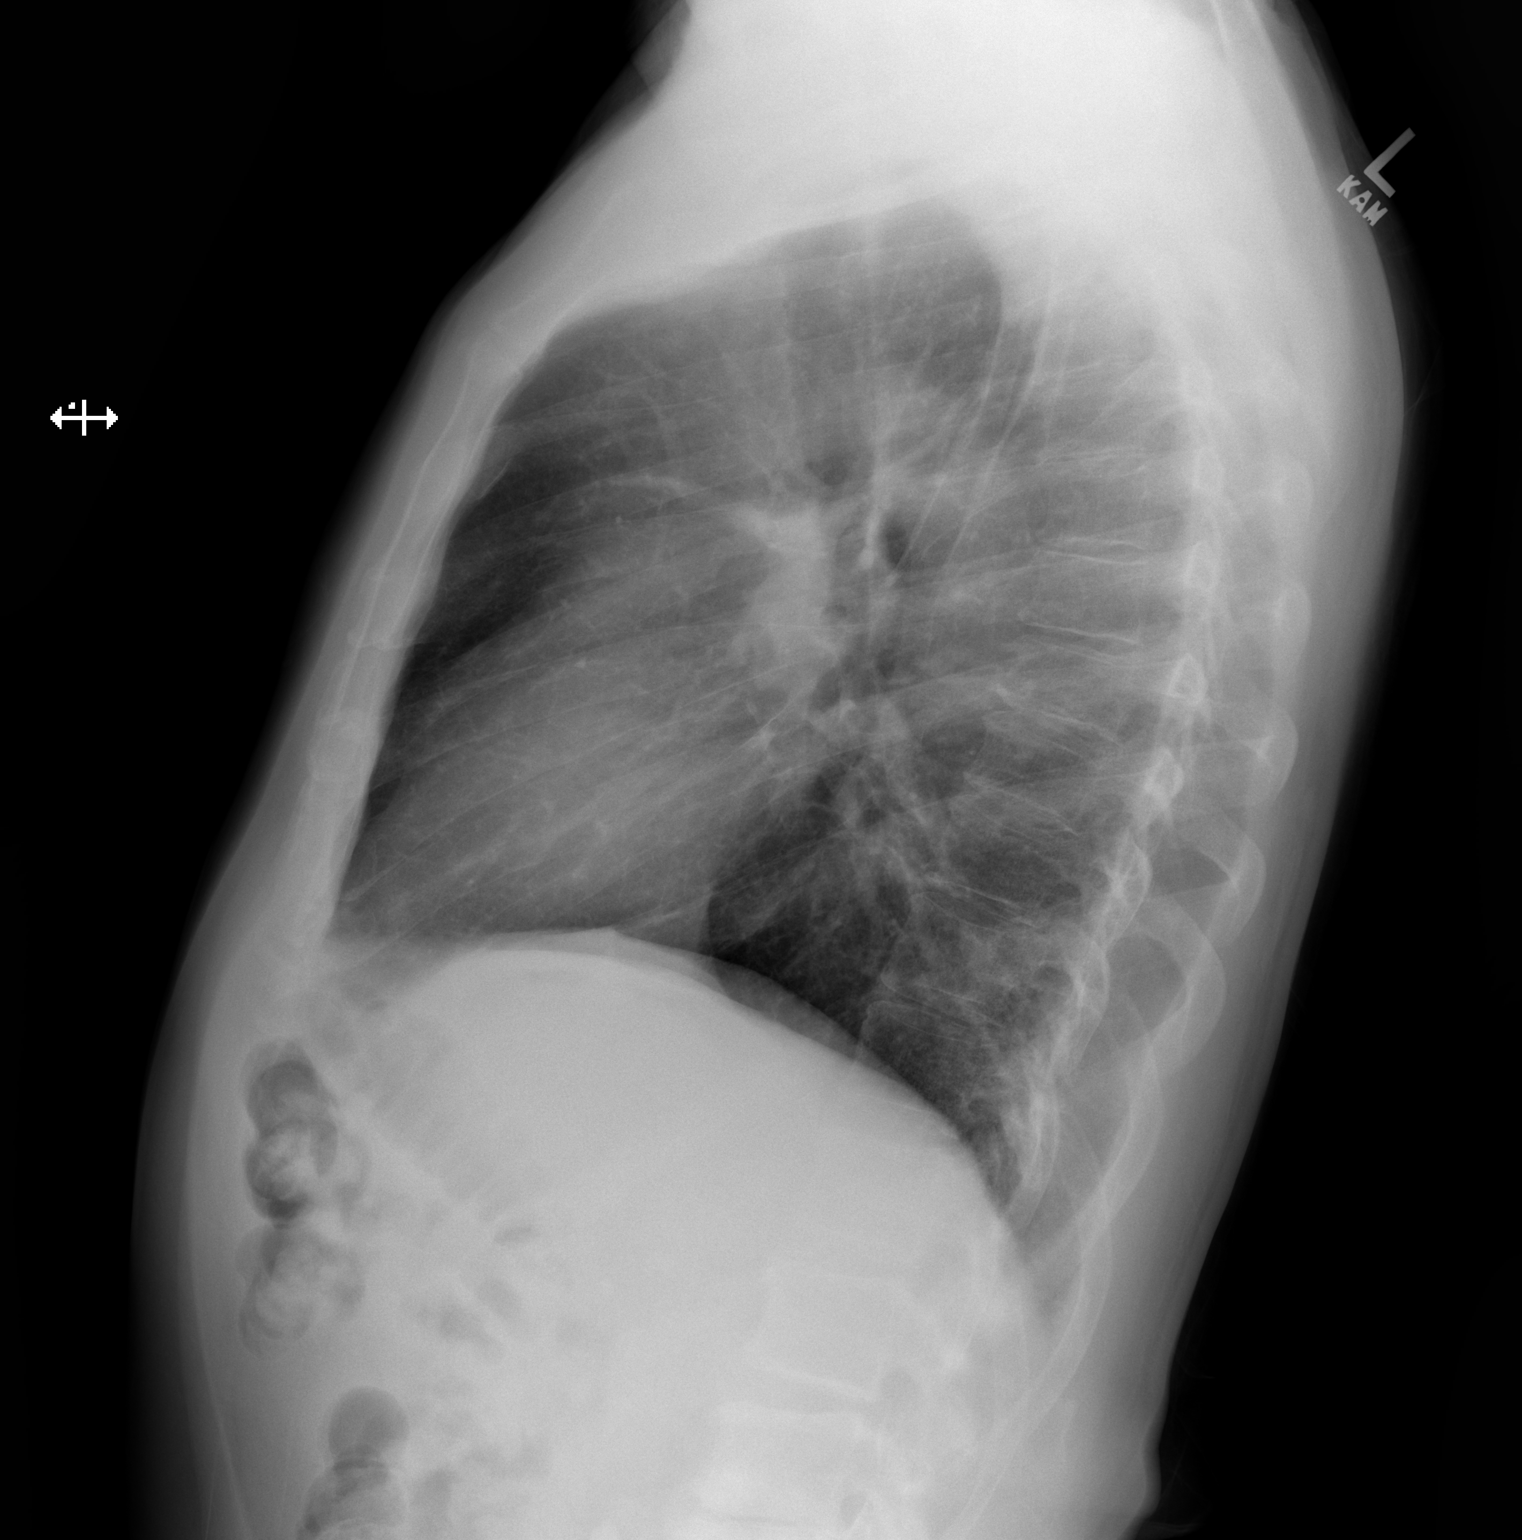

[2 of 2 positions shown; findings below may reference images not displayed]

FINDINGS: The heart size and mediastinal contours are within normal limits.
Both lungs are clear. The visualized skeletal structures are
unremarkable.
IMPRESSION: No active cardiopulmonary disease.

## 2023-10-10 ENCOUNTER — Other Ambulatory Visit (HOSPITAL_COMMUNITY): Payer: Self-pay

## 2023-10-10 MED ORDER — OXYCODONE HCL 15 MG PO TABS
15.0000 mg | ORAL_TABLET | Freq: Four times a day (QID) | ORAL | 0 refills | Status: AC | PRN
  Filled 2023-10-10: qty 120, 30d supply, fill #0

## 2023-11-08 ENCOUNTER — Other Ambulatory Visit (HOSPITAL_COMMUNITY): Payer: Self-pay

## 2023-11-08 MED ORDER — OXYCODONE HCL 15 MG PO TABS
15.0000 mg | ORAL_TABLET | Freq: Four times a day (QID) | ORAL | 0 refills | Status: AC | PRN
  Filled 2023-11-09: qty 120, 30d supply, fill #0

## 2023-11-09 ENCOUNTER — Other Ambulatory Visit (HOSPITAL_COMMUNITY): Payer: Self-pay

## 2023-12-07 ENCOUNTER — Other Ambulatory Visit (HOSPITAL_COMMUNITY): Payer: Self-pay

## 2023-12-07 MED ORDER — OXYCODONE HCL 15 MG PO TABS
15.0000 mg | ORAL_TABLET | Freq: Four times a day (QID) | ORAL | 0 refills | Status: AC | PRN
  Filled 2023-12-09: qty 120, 30d supply, fill #0

## 2023-12-09 ENCOUNTER — Other Ambulatory Visit (HOSPITAL_COMMUNITY): Payer: Self-pay

## 2024-01-05 ENCOUNTER — Other Ambulatory Visit (HOSPITAL_COMMUNITY): Payer: Self-pay

## 2024-01-05 MED ORDER — OXYCODONE HCL 15 MG PO TABS
15.0000 mg | ORAL_TABLET | Freq: Four times a day (QID) | ORAL | 0 refills | Status: AC | PRN
  Filled 2024-01-07: qty 120, 30d supply, fill #0

## 2024-01-07 ENCOUNTER — Other Ambulatory Visit (HOSPITAL_COMMUNITY): Payer: Self-pay

## 2024-02-03 ENCOUNTER — Other Ambulatory Visit (HOSPITAL_COMMUNITY): Payer: Self-pay

## 2024-02-03 MED ORDER — OXYCODONE HCL 15 MG PO TABS
15.0000 mg | ORAL_TABLET | Freq: Four times a day (QID) | ORAL | 0 refills | Status: AC | PRN
  Filled 2024-02-07: qty 120, 30d supply, fill #0

## 2024-02-07 ENCOUNTER — Other Ambulatory Visit (HOSPITAL_COMMUNITY): Payer: Self-pay

## 2024-03-05 ENCOUNTER — Other Ambulatory Visit (HOSPITAL_COMMUNITY): Payer: Self-pay

## 2024-03-05 MED ORDER — OXYCODONE HCL 15 MG PO TABS
15.0000 mg | ORAL_TABLET | Freq: Four times a day (QID) | ORAL | 0 refills | Status: AC
  Filled 2024-03-08: qty 120, 30d supply, fill #0

## 2024-03-08 ENCOUNTER — Other Ambulatory Visit (HOSPITAL_COMMUNITY): Payer: Self-pay

## 2024-04-05 ENCOUNTER — Other Ambulatory Visit (HOSPITAL_COMMUNITY): Payer: Self-pay

## 2024-04-05 MED ORDER — OXYCODONE HCL 15 MG PO TABS
15.0000 mg | ORAL_TABLET | Freq: Four times a day (QID) | ORAL | 0 refills | Status: AC | PRN
  Filled 2024-04-07: qty 120, 30d supply, fill #0

## 2024-04-07 ENCOUNTER — Other Ambulatory Visit (HOSPITAL_COMMUNITY): Payer: Self-pay

## 2024-05-04 ENCOUNTER — Other Ambulatory Visit (HOSPITAL_COMMUNITY): Payer: Self-pay

## 2024-05-04 MED ORDER — OXYCODONE HCL 15 MG PO TABS
ORAL_TABLET | ORAL | 0 refills | Status: AC
  Filled 2024-05-07: qty 120, 30d supply, fill #0

## 2024-05-07 ENCOUNTER — Other Ambulatory Visit (HOSPITAL_COMMUNITY): Payer: Self-pay

## 2024-05-11 ENCOUNTER — Other Ambulatory Visit: Payer: Self-pay | Admitting: Neurology

## 2024-05-14 NOTE — Telephone Encounter (Signed)
 Last seen on 102/24 No 6 month follow up scheduled   It appears pt no showed for EEG visit.   Rx denied

## 2024-05-17 ENCOUNTER — Other Ambulatory Visit: Payer: Self-pay

## 2024-06-04 ENCOUNTER — Other Ambulatory Visit (HOSPITAL_COMMUNITY): Payer: Self-pay

## 2024-06-04 MED ORDER — OXYCODONE HCL 15 MG PO TABS
15.0000 mg | ORAL_TABLET | Freq: Four times a day (QID) | ORAL | 0 refills | Status: DC | PRN
  Filled 2024-06-06: qty 120, 30d supply, fill #0

## 2024-06-06 ENCOUNTER — Other Ambulatory Visit (HOSPITAL_COMMUNITY): Payer: Self-pay

## 2024-06-14 ENCOUNTER — Other Ambulatory Visit (HOSPITAL_COMMUNITY): Payer: Self-pay

## 2024-06-14 MED ORDER — DOXYCYCLINE HYCLATE 100 MG PO CAPS
100.0000 mg | ORAL_CAPSULE | Freq: Two times a day (BID) | ORAL | 0 refills | Status: AC
  Filled 2024-06-14: qty 14, 7d supply, fill #0

## 2024-06-26 ENCOUNTER — Other Ambulatory Visit: Payer: Self-pay

## 2024-07-06 ENCOUNTER — Other Ambulatory Visit (HOSPITAL_COMMUNITY): Payer: Self-pay

## 2024-07-06 MED ORDER — OXYCODONE HCL 15 MG PO TABS
15.0000 mg | ORAL_TABLET | Freq: Four times a day (QID) | ORAL | 0 refills | Status: AC | PRN
  Filled 2024-07-06: qty 120, 30d supply, fill #0

## 2024-08-01 ENCOUNTER — Other Ambulatory Visit (HOSPITAL_COMMUNITY): Payer: Self-pay

## 2024-08-01 MED ORDER — OXYCODONE HCL 15 MG PO TABS
15.0000 mg | ORAL_TABLET | Freq: Four times a day (QID) | ORAL | 0 refills | Status: AC | PRN
  Filled 2024-08-06: qty 120, 30d supply, fill #0

## 2024-08-04 ENCOUNTER — Other Ambulatory Visit (HOSPITAL_COMMUNITY): Payer: Self-pay

## 2024-08-06 ENCOUNTER — Other Ambulatory Visit (HOSPITAL_COMMUNITY): Payer: Self-pay

## 2024-08-25 ENCOUNTER — Other Ambulatory Visit (HOSPITAL_COMMUNITY): Payer: Self-pay

## 2024-09-03 ENCOUNTER — Other Ambulatory Visit (HOSPITAL_COMMUNITY): Payer: Self-pay

## 2024-09-03 ENCOUNTER — Other Ambulatory Visit: Payer: Self-pay

## 2024-09-03 MED ORDER — OXYCODONE HCL 15 MG PO TABS
15.0000 mg | ORAL_TABLET | Freq: Four times a day (QID) | ORAL | 0 refills | Status: AC | PRN
  Filled 2024-09-05: qty 120, 30d supply, fill #0

## 2024-09-05 ENCOUNTER — Other Ambulatory Visit: Payer: Self-pay

## 2024-09-05 ENCOUNTER — Other Ambulatory Visit (HOSPITAL_COMMUNITY): Payer: Self-pay

## 2024-11-06 ENCOUNTER — Ambulatory Visit: Admitting: Internal Medicine
